# Patient Record
Sex: Male | Born: 1960 | Race: White | Hispanic: No | Smoking: Current some day smoker
Health system: Southern US, Community
[De-identification: ages and names within clinical notes are randomized; demographics above are authoritative.]

---

## 2020-04-04 ENCOUNTER — Other Ambulatory Visit: Payer: Self-pay

## 2020-04-04 ENCOUNTER — Emergency Department: Payer: Self-pay

## 2020-04-04 ENCOUNTER — Inpatient Hospital Stay
Admission: EM | Admit: 2020-04-04 | Discharge: 2020-05-03 | DRG: 871 | Disposition: E | Payer: Self-pay | Attending: Pulmonary Disease | Admitting: Pulmonary Disease

## 2020-04-04 DIAGNOSIS — A419 Sepsis, unspecified organism: Principal | ICD-10-CM | POA: Diagnosis present

## 2020-04-04 DIAGNOSIS — Z20822 Contact with and (suspected) exposure to covid-19: Secondary | ICD-10-CM | POA: Diagnosis present

## 2020-04-04 DIAGNOSIS — Z599 Problem related to housing and economic circumstances, unspecified: Secondary | ICD-10-CM

## 2020-04-04 DIAGNOSIS — J9801 Acute bronchospasm: Secondary | ICD-10-CM | POA: Diagnosis present

## 2020-04-04 DIAGNOSIS — K047 Periapical abscess without sinus: Secondary | ICD-10-CM | POA: Diagnosis present

## 2020-04-04 DIAGNOSIS — I5082 Biventricular heart failure: Secondary | ICD-10-CM | POA: Diagnosis present

## 2020-04-04 DIAGNOSIS — I5021 Acute systolic (congestive) heart failure: Secondary | ICD-10-CM | POA: Diagnosis present

## 2020-04-04 DIAGNOSIS — R570 Cardiogenic shock: Secondary | ICD-10-CM | POA: Diagnosis not present

## 2020-04-04 DIAGNOSIS — F17210 Nicotine dependence, cigarettes, uncomplicated: Secondary | ICD-10-CM | POA: Diagnosis present

## 2020-04-04 DIAGNOSIS — J90 Pleural effusion, not elsewhere classified: Secondary | ICD-10-CM

## 2020-04-04 DIAGNOSIS — E872 Acidosis: Secondary | ICD-10-CM | POA: Diagnosis present

## 2020-04-04 DIAGNOSIS — F419 Anxiety disorder, unspecified: Secondary | ICD-10-CM | POA: Diagnosis present

## 2020-04-04 DIAGNOSIS — I4891 Unspecified atrial fibrillation: Secondary | ICD-10-CM

## 2020-04-04 DIAGNOSIS — I493 Ventricular premature depolarization: Secondary | ICD-10-CM | POA: Diagnosis present

## 2020-04-04 DIAGNOSIS — R739 Hyperglycemia, unspecified: Secondary | ICD-10-CM | POA: Diagnosis present

## 2020-04-04 DIAGNOSIS — I469 Cardiac arrest, cause unspecified: Secondary | ICD-10-CM

## 2020-04-04 DIAGNOSIS — Z825 Family history of asthma and other chronic lower respiratory diseases: Secondary | ICD-10-CM

## 2020-04-04 DIAGNOSIS — J189 Pneumonia, unspecified organism: Secondary | ICD-10-CM

## 2020-04-04 DIAGNOSIS — I110 Hypertensive heart disease with heart failure: Secondary | ICD-10-CM | POA: Diagnosis present

## 2020-04-04 LAB — CBC
HCT: 43.4 % (ref 39.0–52.0)
Hemoglobin: 15.1 g/dL (ref 13.0–17.0)
MCH: 33.4 pg (ref 26.0–34.0)
MCHC: 34.8 g/dL (ref 30.0–36.0)
MCV: 96 fL (ref 80.0–100.0)
Platelets: 278 10*3/uL (ref 150–400)
RBC: 4.52 MIL/uL (ref 4.22–5.81)
RDW: 14.3 % (ref 11.5–15.5)
WBC: 11.7 10*3/uL — ABNORMAL HIGH (ref 4.0–10.5)
nRBC: 0 % (ref 0.0–0.2)

## 2020-04-04 LAB — BASIC METABOLIC PANEL
Anion gap: 15 (ref 5–15)
BUN: 12 mg/dL (ref 6–20)
CO2: 20 mmol/L — ABNORMAL LOW (ref 22–32)
Calcium: 8.6 mg/dL — ABNORMAL LOW (ref 8.9–10.3)
Chloride: 99 mmol/L (ref 98–111)
Creatinine, Ser: 1.22 mg/dL (ref 0.61–1.24)
GFR, Estimated: >60 mL/min (ref >60)
Glucose, Bld: 167 mg/dL — ABNORMAL HIGH (ref 70–99)
Potassium: 3.8 mmol/L (ref 3.5–5.1)
Sodium: 134 mmol/L — ABNORMAL LOW (ref 135–145)

## 2020-04-04 LAB — TROPONIN I (HIGH SENSITIVITY): Troponin I (High Sensitivity): 107 ng/L (ref <18)

## 2020-04-04 NOTE — ED Notes (Signed)
Troponin of 107 called from lab. Elon Jester, charge rn notified.

## 2020-04-04 NOTE — ED Notes (Signed)
Water provided. Explanation of wait provided to pt who verbalizes understanding.
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
## 2020-04-04 NOTE — ED Provider Notes (Signed)
[facility] Emergency Department Provider Note ____________________________________________   First MD Initiated Contact with Patient 04/26/2020 2344     (approximate)  I have reviewed the triage vital signs and the nursing notes.  HISTORY  Chief Complaint Back Pain   HPI Raymond Harvey is a 59 y.o. malewho presents to the ED for evaluation of back pain.   Chart review indicates no previous visits within our system. Patient reports that he lives locally but has not seen a physician in many years.  No known medical problems and no prescription medications. Lifelong cigarette smoker, currently smoking 1 PPD.  No other recreational drugs. Reports being prescribed antibiotics 2 days ago for dental infection by a dentist, reports compliance with these medications. He is not vaccinated for COVID-19, and has no known positive contacts.  Patient presents to the ED with 2 days of intermittent back pain, nonproductive cough and dyspnea on exertion.  He reports intermittent back pains, worsened in a pleuritic fashion with respirations, that last a matter of hours before self resolving.  He reports associated dyspnea on exertion.  He reports a consistent nonproductive cough for a few days increased from his baseline smoker's cough.  Denies increased sputum production.  Denies fevers, emesis, abdominal pain, diarrhea, sore throat, neck pain, falls, syncope or trauma.  He does reports associated increased lower extremity swelling, and decreased p.o. intake that he attributes to dental pain.  Reports decreased urinary output and dark urine without dysuria or hematuria.  He reports that he does not have insurance and has significant stress and anxiety related to the finances of this visits and likely hospitalization.   History reviewed. No pertinent past medical history.  Patient Active Problem List   Diagnosis Date Noted  . CAP (community acquired pneumonia) 04/05/2020     History reviewed. No pertinent surgical history.  Prior to Admission medications   Medication Sig Start Date End Date Taking? Authorizing Provider  amoxicillin-clavulanate (AUGMENTIN) 500-125 MG tablet Take 1 tablet by mouth in the morning, at noon, and at bedtime. 04/02/20  Yes [provider]    Allergies Patient has no allergy information on record.  History reviewed. No pertinent family history.  Social History Social History   Tobacco Use  . Smoking status: Current Some Day Smoker    Types: Cigarettes  . Smokeless tobacco: Never Used  Substance Use Topics  . Alcohol use: Not on file  . Drug use: Not on file    Review of Systems  Constitutional: No fever/chills Eyes: No visual changes. ENT: No sore throat. Cardiovascular: Positive for chest and back pain. Respiratory: Positive for shortness of breath, nonproductive cough and DOE Gastrointestinal: No abdominal pain.  No nausea, no vomiting.  No diarrhea.  No constipation. Genitourinary: Negative for dysuria.  Positive for decreased urinary output and dark urine. Musculoskeletal: Negative for back pain. Skin: Negative for rash. Neurological: Negative for headaches, focal weakness or numbness.  ____________________________________________   PHYSICAL EXAM:  VITAL SIGNS: Vitals:   04/05/20 0200 04/05/20 0215  BP: (!) 146/112 (!) 126/95  Pulse: (!) 33 62  Resp: (!) 26 (!) 25  Temp:    SpO2:       Constitutional: Alert and oriented. Well appearing and in no acute distress. Eyes: Conjunctivae are normal. PERRL. EOMI. Head: Atraumatic. Nose: No congestion/rhinnorhea. Mouth/Throat: Mucous membranes are moist.  Oropharynx non-erythematous. Neck: No stridor. No cervical spine tenderness to palpation. Cardiovascular: Normal rate, regular rhythm. Grossly normal heart sounds.  Good peripheral circulation. Respiratory:  Normal respiratory effort.  No retractions. Lungs CTAB. Gastrointestinal: Soft ,  nondistended, nontender to palpation. No CVA tenderness. Musculoskeletal: No lower extremity tenderness nor edema.  No joint effusions. No signs of acute trauma. Neurologic:  Normal speech and language. No gross focal neurologic deficits are appreciated. No gait instability noted. Skin:  Skin is warm, dry and intact. No rash noted. Psychiatric: Mood and affect are normal. Speech and behavior are normal.  ____________________________________________   LABS (all labs ordered are listed, but only abnormal results are displayed)  Labs Reviewed  CBC - Abnormal; Notable for the following components:      Result Value   WBC 11.7 (*)    All other components within normal limits  BASIC METABOLIC PANEL - Abnormal; Notable for the following components:   Sodium 134 (*)    CO2 20 (*)    Glucose, Bld 167 (*)    Calcium 8.6 (*)    All other components within normal limits  BRAIN NATRIURETIC PEPTIDE - Abnormal; Notable for the following components:   B Natriuretic Peptide 3,879.9 (*)    All other components within normal limits  LACTIC ACID, PLASMA - Abnormal; Notable for the following components:   Lactic Acid, Venous 3.0 (*)    All other components within normal limits  TROPONIN I (HIGH SENSITIVITY) - Abnormal; Notable for the following components:   Troponin I (High Sensitivity) 107 (*)    All other components within normal limits  TROPONIN I (HIGH SENSITIVITY) - Abnormal; Notable for the following components:   Troponin I (High Sensitivity) 91 (*)    All other components within normal limits  RESP PANEL BY RT-PCR (FLU A&B, COVID) ARPGX2  CULTURE, BLOOD (SINGLE)  EXPECTORATED SPUTUM ASSESSMENT W REFEX TO RESP CULTURE  MAGNESIUM  LACTIC ACID, PLASMA  HIV ANTIBODY (ROUTINE TESTING W REFLEX)  LEGIONELLA PNEUMOPHILA SEROGP 1 UR AG  STREP PNEUMONIAE URINARY ANTIGEN  TSH  BASIC METABOLIC PANEL  CBC   ____________________________________________  12 Lead EKG  A. fib, rate of 160 bpm.   Leftward axis.  1 PVC.  No evidence of acute ischemia. ____________________________________________  RADIOLOGY  ED MD interpretation:  2 view CXR reviewed by me with evidence of small bilateral pleural effusions  Official radiology report(s): DG Chest 2 View  Result Date: 04-14-2020 CLINICAL DATA:  Cough, shortness of breath EXAM: CHEST - 2 VIEW COMPARISON:  None. FINDINGS: Bilateral pleural effusions, left greater than right with some additional opacity in the left lung base some of which could be atelectatic though more patchy adjacent airspace opacity is suggestive of some underlying consolidation as well. No pneumothorax. Pulmonary vascularity is normally distributed. The cardiomediastinal contours are unremarkable. Degenerative changes are present in the imaged spine and shoulders. No acute osseous or soft tissue abnormality. IMPRESSION: Bilateral pleural effusions, left greater than right. Adjacent opacity could reflect a combination of atelectasis and underlying airspace disease. At minimum, follow-up imaging in 6-8 weeks following therapeutic regimen should be performed to ensure resolution. Electronically Signed   By: Kreg Shropshire M.D.   On: April 14, 2020 23:18   CT Angio Chest PE W and/or Wo Contrast  Result Date: 04/05/2020 CLINICAL DATA:  Pleuritic chest pain EXAM: CT ANGIOGRAPHY CHEST WITH CONTRAST TECHNIQUE: Multidetector CT imaging of the chest was performed using the standard protocol during bolus administration of intravenous contrast. Multiplanar CT image reconstructions and MIPs were obtained to evaluate the vascular anatomy. CONTRAST:  77mL OMNIPAQUE IOHEXOL 350 MG/ML SOLN COMPARISON:  None. FINDINGS: Cardiovascular: Contrast injection is sufficient  to demonstrate satisfactory opacification of the pulmonary arteries to the segmental level. There is no pulmonary embolus or evidence of right heart strain. The size of the main pulmonary artery is normal. Heart size is normal, with no  pericardial effusion. The course and caliber of the aorta are normal. There is no atherosclerotic calcification. Opacification decreased due to pulmonary arterial phase contrast bolus timing. Mediastinum/Nodes: No mediastinal, hilar or axillary lymphadenopathy. Normal visualized thyroid. Thoracic esophageal course is normal. Lungs/Pleura: Small right and large left pleural effusions. Area of consolidation in the peripheral left lower lobe. Upper Abdomen: Contrast bolus timing is not optimized for evaluation of the abdominal organs. The visualized portions of the organs of the upper abdomen are normal. Musculoskeletal: No chest wall abnormality. No bony spinal canal stenosis. Review of the MIP images confirms the above findings. IMPRESSION: 1. No pulmonary embolus. 2. Small right and large left pleural effusions. 3. Area of consolidation in the peripheral left lower lobe may indicate pneumonia, aspiration or atelectasis. Electronically Signed   By: Deatra Robinson M.D.   On: 04/05/2020 01:05    ____________________________________________   PROCEDURES and INTERVENTIONS  Procedure(s) performed (including Critical Care):  .1-3 Lead EKG Interpretation Performed by: Delton Prairie, MD Authorized by: Delton Prairie, MD     Interpretation: abnormal     ECG rate:  140   ECG rate assessment: tachycardic     Rhythm: atrial fibrillation     Ectopy: PVCs     Conduction: normal   .Critical Care Performed by: Delton Prairie, MD Authorized by: Delton Prairie, MD   Critical care provider statement:    Critical care time (minutes):  45   Critical care was necessary to treat or prevent imminent or life-threatening deterioration of the following conditions:  Cardiac failure and circulatory failure   Critical care was time spent personally by me on the following activities:  Discussions with consultants, evaluation of patient's response to treatment, examination of patient, ordering and performing treatments and  interventions, ordering and review of laboratory studies, ordering and review of radiographic studies, pulse oximetry, re-evaluation of patient's condition, obtaining history from patient or surrogate and review of old charts    Medications  lactated ringers infusion (has no administration in time range)  cefTRIAXone (ROCEPHIN) 2 g in sodium chloride 0.9 % 100 mL IVPB (0 g Intravenous Stopped 04/05/20 0143)  azithromycin (ZITHROMAX) 500 mg in sodium chloride 0.9 % 250 mL IVPB (500 mg Intravenous New Bag/Given 04/05/20 0200)  enoxaparin (LOVENOX) injection 40 mg (has no administration in time range)  0.9 %  sodium chloride infusion (has no administration in time range)  acetaminophen (TYLENOL) tablet 650 mg (has no administration in time range)    Or  acetaminophen (TYLENOL) suppository 650 mg (has no administration in time range)  traZODone (DESYREL) tablet 25 mg (has no administration in time range)  magnesium hydroxide (MILK OF MAGNESIA) suspension 30 mL (has no administration in time range)  ondansetron (ZOFRAN) tablet 4 mg (has no administration in time range)    Or  ondansetron (ZOFRAN) injection 4 mg (has no administration in time range)  guaiFENesin (MUCINEX) 12 hr tablet 600 mg (has no administration in time range)  chlorpheniramine-HYDROcodone (TUSSIONEX) 10-8 MG/5ML suspension 5 mL (has no administration in time range)  aspirin chewable tablet 324 mg (has no administration in time range)  diltiazem (CARDIZEM) 125 mg in dextrose 5% 125 mL (1 mg/mL) infusion (has no administration in time range)  morphine 4 MG/ML injection 4 mg (has no administration  in time range)  ipratropium-albuterol (DUONEB) 0.5-2.5 (3) MG/3ML nebulizer solution 3 mL (has no administration in time range)  diltiazem (CARDIZEM) injection 20 mg (20 mg Intravenous Given 04/05/20 0037)  lactated ringers bolus 500 mL (500 mLs Intravenous New Bag/Given 04/05/20 0110)  iohexol (OMNIPAQUE) 350 MG/ML injection 75 mL (75 mLs  Intravenous Contrast Given 04/05/20 0044)  LORazepam (ATIVAN) tablet 0.5 mg (0.5 mg Oral Given 04/05/20 0119)    ____________________________________________   MDM / ED COURSE   59 year old smoker presents to the ED with new onset A. fib, likely precipitated by pleural effusions caused by pneumonia, requiring medical admission.  Patient hemodynamically stable in A. fib with RVR, on room air.  Exam demonstrates a very anxious patient who has no evidence of trauma or neurovascular deficits.  Patient provided bolus of IV diltiazem with good effect, and subsequently transitioned to a diltiazem drip for rate control.  His blood work demonstrates a lactic acidosis to 3.0 as well as an elevated troponin to 100, likely demand ischemia in the setting of his rapid rates for the past 2 days.  Repeat troponin downtrending.  EKG shows no ischemic features and his A. fib with RVR.  CXR shows a possible infiltrate on the left.  There is lack of documented medical history, pleuritic pains and new A. fib, CTA chest obtained and does not show evidence of acute PE, but does demonstrate a large left-sided pleural effusion around a small focal infiltrate to his LLL concerning for CAP.  Blood cultures were drawn and patient was provided CAP coverage.  Patient bolused 500 cc of fluids, and further bolus was held due to evidence of volume overload clinically and with his elevated BNP.  No indications for urgent thoracentesis in the ED, considering he is stable on room air.  Possibly parapneumonic effusion versus malignant in setting of his lifelong of smoking.  Admitted to hospitalist.   Clinical Course as of Apr 05 304  Sat Apr 05, 2020  0023 Elevated lactic acid noted.  Due to his infiltrate on x-ray and mild leukocytosis, concern for sepsis.  We will initiate sepsis order set, draw blood cultures and provide CAP coverage   [DS]  0025 Due to his lower extremity edema, hemodynamically stable status, we will currently  abstain from full 20 cc/kg of IV fluids as we await the remainder of his work-up   [DS]  0044 Reassessed.  Patient in CT   [DS]  0103 Reassessed.  Heart rate back up into the 140s.  We will start diltiazem drip.   [DS]  0104 Patient continues to be quite nervous and continues to stare at the telemetry monitor, asking about all the numbers.  This is the third time of explained everything to him.  I have attempted to alter the alert settings so the long does not go off as much due to his elevated heart rate and A. fib to help alleviate his anxiety.   [DS]  0104 We will provide a small oral dose of a calming agent   [DS]  0125 Educated patient on CT findings.   [DS]    Clinical Course User Index [DS] Delton PrairieSmith, Layloni Fahrner, MD    ____________________________________________   FINAL CLINICAL IMPRESSION(S) / ED DIAGNOSES  Final diagnoses:  New onset atrial fibrillation (HCC)  Atrial fibrillation with RVR (HCC)  Bilateral pleural effusion  Community acquired pneumonia of left lower lobe of lung     ED Discharge Orders    None  Delton Prairie   Note:  This document was prepared using Conservation officer, historic buildings and may include unintentional dictation errors.   Delton Prairie, MD 04/05/20 [phone]

## 2020-04-04 NOTE — ED Triage Notes (Signed)
Pt to ED via [facility] from home. Pt stating mid upper back pain and increased SOB for a few days. Pt stating pain increases with deep inspirations. Pt stating on antibiotics Tuesday for tooth infection.  Pt denies any medical hx.

## 2020-04-05 ENCOUNTER — Emergency Department: Payer: Self-pay

## 2020-04-05 ENCOUNTER — Inpatient Hospital Stay (HOSPITAL_COMMUNITY)
Admit: 2020-04-05 | Discharge: 2020-04-05 | Disposition: A | Discharge status: Home / Self Care | Payer: Self-pay | Attending: Family Medicine | Admitting: Family Medicine

## 2020-04-05 ENCOUNTER — Encounter: Payer: Self-pay | Admitting: Radiology

## 2020-04-05 DIAGNOSIS — R030 Elevated blood-pressure reading, without diagnosis of hypertension: Secondary | ICD-10-CM

## 2020-04-05 DIAGNOSIS — J189 Pneumonia, unspecified organism: Secondary | ICD-10-CM | POA: Diagnosis present

## 2020-04-05 DIAGNOSIS — A419 Sepsis, unspecified organism: Principal | ICD-10-CM

## 2020-04-05 DIAGNOSIS — I5020 Unspecified systolic (congestive) heart failure: Secondary | ICD-10-CM | POA: Insufficient documentation

## 2020-04-05 DIAGNOSIS — R778 Other specified abnormalities of plasma proteins: Secondary | ICD-10-CM

## 2020-04-05 DIAGNOSIS — R739 Hyperglycemia, unspecified: Secondary | ICD-10-CM

## 2020-04-05 DIAGNOSIS — I5031 Acute diastolic (congestive) heart failure: Secondary | ICD-10-CM

## 2020-04-05 DIAGNOSIS — R6520 Severe sepsis without septic shock: Secondary | ICD-10-CM

## 2020-04-05 DIAGNOSIS — I340 Nonrheumatic mitral (valve) insufficiency: Secondary | ICD-10-CM

## 2020-04-05 DIAGNOSIS — I4891 Unspecified atrial fibrillation: Secondary | ICD-10-CM

## 2020-04-05 DIAGNOSIS — I361 Nonrheumatic tricuspid (valve) insufficiency: Secondary | ICD-10-CM

## 2020-04-05 DIAGNOSIS — Z720 Tobacco use: Secondary | ICD-10-CM

## 2020-04-05 LAB — LACTIC ACID, PLASMA
Lactic Acid, Venous: 6.3 mmol/L (ref 0.5–1.9)
Lactic Acid, Venous: 3.5 mmol/L (ref 0.5–1.9)
Lactic Acid, Venous: 2.8 mmol/L (ref 0.5–1.9)
Lactic Acid, Venous: 3 mmol/L (ref 0.5–1.9)
Lactic Acid, Venous: 2.6 mmol/L (ref 0.5–1.9)
Lactic Acid, Venous: 2.8 mmol/L (ref 0.5–1.9)

## 2020-04-05 LAB — CBC
HCT: 42.3 % (ref 39.0–52.0)
Hemoglobin: 14 g/dL (ref 13.0–17.0)
MCH: 32.6 pg (ref 26.0–34.0)
MCHC: 33.1 g/dL (ref 30.0–36.0)
MCV: 98.4 fL (ref 80.0–100.0)
Platelets: 256 10*3/uL (ref 150–400)
RBC: 4.3 MIL/uL (ref 4.22–5.81)
RDW: 14.5 % (ref 11.5–15.5)
WBC: 12.6 10*3/uL — ABNORMAL HIGH (ref 4.0–10.5)
nRBC: 0 % (ref 0.0–0.2)

## 2020-04-05 LAB — LIPID PANEL
Cholesterol: 114 mg/dL (ref 0–200)
HDL: 23 mg/dL — ABNORMAL LOW (ref >40)
LDL Cholesterol: 78 mg/dL (ref 0–99)
Total CHOL/HDL Ratio: 5 RATIO
Triglycerides: 63 mg/dL (ref <150)
VLDL: 13 mg/dL (ref 0–40)

## 2020-04-05 LAB — RESP PANEL BY RT-PCR (FLU A&B, COVID) ARPGX2
Influenza A by PCR: NEGATIVE
Influenza B by PCR: NEGATIVE
SARS Coronavirus 2 by RT PCR: NEGATIVE

## 2020-04-05 LAB — ECHOCARDIOGRAM COMPLETE
Calc EF: 16.2 %
Height: 68 in
S' Lateral: 5.74 cm
Single Plane A2C EF: 13.1 %
Single Plane A4C EF: 13.4 %
Weight: 2240 oz

## 2020-04-05 LAB — HIV ANTIBODY (ROUTINE TESTING W REFLEX): HIV Screen 4th Generation wRfx: NONREACTIVE

## 2020-04-05 LAB — TSH: TSH: 3.354 u[IU]/mL (ref 0.350–4.500)

## 2020-04-05 LAB — PROCALCITONIN: Procalcitonin: 0.42 ng/mL

## 2020-04-05 LAB — PROTIME-INR
INR: 1.4 — ABNORMAL HIGH (ref 0.8–1.2)
Prothrombin Time: 16.7 seconds — ABNORMAL HIGH (ref 11.4–15.2)

## 2020-04-05 LAB — MAGNESIUM: Magnesium: 1.7 mg/dL (ref 1.7–2.4)

## 2020-04-05 LAB — HEPARIN LEVEL (UNFRACTIONATED): Heparin Unfractionated: 0.38 IU/mL (ref 0.30–0.70)

## 2020-04-05 LAB — TROPONIN I (HIGH SENSITIVITY): Troponin I (High Sensitivity): 91 ng/L — ABNORMAL HIGH (ref <18)

## 2020-04-05 LAB — HEMOGLOBIN A1C
Hgb A1c MFr Bld: 6.2 % — ABNORMAL HIGH (ref 4.8–5.6)
Mean Plasma Glucose: 131.24 mg/dL

## 2020-04-05 LAB — BRAIN NATRIURETIC PEPTIDE: B Natriuretic Peptide: 3879.9 pg/mL — ABNORMAL HIGH (ref 0.0–100.0)

## 2020-04-05 LAB — STREP PNEUMONIAE URINARY ANTIGEN: Strep Pneumo Urinary Antigen: NEGATIVE

## 2020-04-05 LAB — APTT: aPTT: 31 seconds (ref 24–36)

## 2020-04-05 MED ORDER — MAGNESIUM HYDROXIDE 400 MG/5ML PO SUSP: 30.0000 mL | Freq: Every day | ORAL | Status: DC | PRN

## 2020-04-05 MED ORDER — MAGNESIUM SULFATE 2 GM/50ML IV SOLN
2.0000 g | Freq: Once | INTRAVENOUS | Status: AC
  Administered 2020-04-05: 2 g via INTRAVENOUS
  Filled 2020-04-05: qty 50

## 2020-04-05 MED ORDER — METHYLPREDNISOLONE SODIUM SUCC 40 MG IJ SOLR
40.0000 mg | Freq: Three times a day (TID) | INTRAMUSCULAR | Status: DC
  Administered 2020-04-05 (×2): 40 mg via INTRAVENOUS
  Filled 2020-04-05 (×2): qty 1

## 2020-04-05 MED ORDER — SODIUM CHLORIDE 0.9 % IV SOLN: INTRAVENOUS | Status: DC

## 2020-04-05 MED ORDER — FUROSEMIDE 10 MG/ML IJ SOLN
INTRAMUSCULAR | Status: AC
  Administered 2020-04-05: 40 mg via INTRAVENOUS
  Filled 2020-04-05: qty 4

## 2020-04-05 MED ORDER — POTASSIUM CHLORIDE 20 MEQ PO PACK
40.0000 meq | PACK | Freq: Once | ORAL | Status: AC
  Administered 2020-04-05: 40 meq via ORAL
  Filled 2020-04-05: qty 2

## 2020-04-05 MED ORDER — ACETAMINOPHEN 650 MG RE SUPP: 650.0000 mg | Freq: Four times a day (QID) | RECTAL | Status: DC | PRN

## 2020-04-05 MED ORDER — SODIUM CHLORIDE 0.9 % IV SOLN
2.0000 g | INTRAVENOUS | Status: DC
  Administered 2020-04-05 – 2020-04-06 (×2): 2 g via INTRAVENOUS
  Filled 2020-04-05 (×2): qty 20

## 2020-04-05 MED ORDER — HEPARIN BOLUS VIA INFUSION
4000.0000 [IU] | Freq: Once | INTRAVENOUS | Status: AC
  Administered 2020-04-05: 4000 [IU] via INTRAVENOUS
  Filled 2020-04-05: qty 4000

## 2020-04-05 MED ORDER — NITROGLYCERIN 2 % TD OINT
1.0000 [in_us] | TOPICAL_OINTMENT | Freq: Four times a day (QID) | TRANSDERMAL | Status: DC
  Administered 2020-04-05: 1 [in_us] via TOPICAL
  Filled 2020-04-05 (×2): qty 1

## 2020-04-05 MED ORDER — IOHEXOL 350 MG/ML SOLN
75.0000 mL | Freq: Once | INTRAVENOUS | Status: AC | PRN
  Administered 2020-04-05: 75 mL via INTRAVENOUS

## 2020-04-05 MED ORDER — LACTATED RINGERS IV BOLUS: 1000.0000 mL | Freq: Once | INTRAVENOUS | Status: DC

## 2020-04-05 MED ORDER — ASPIRIN EC 81 MG PO TBEC
81.0000 mg | DELAYED_RELEASE_TABLET | Freq: Every day | ORAL | Status: DC
  Administered 2020-04-05: 81 mg via ORAL
  Filled 2020-04-05: qty 1

## 2020-04-05 MED ORDER — ALUM & MAG HYDROXIDE-SIMETH 200-200-20 MG/5ML PO SUSP
30.0000 mL | Freq: Once | ORAL | Status: AC
  Administered 2020-04-05: 30 mL via ORAL
  Filled 2020-04-05: qty 30

## 2020-04-05 MED ORDER — MORPHINE SULFATE (PF) 4 MG/ML IV SOLN
4.0000 mg | Freq: Once | INTRAVENOUS | Status: AC
  Administered 2020-04-05: 4 mg via INTRAVENOUS
  Filled 2020-04-05: qty 1

## 2020-04-05 MED ORDER — DILTIAZEM HCL 25 MG/5ML IV SOLN
20.0000 mg | Freq: Once | INTRAVENOUS | Status: AC
  Administered 2020-04-05: 20 mg via INTRAVENOUS
  Filled 2020-04-05: qty 5

## 2020-04-05 MED ORDER — PANTOPRAZOLE SODIUM 40 MG IV SOLR
40.0000 mg | Freq: Once | INTRAVENOUS | Status: AC
  Administered 2020-04-05: 40 mg via INTRAVENOUS
  Filled 2020-04-05: qty 40

## 2020-04-05 MED ORDER — DILTIAZEM HCL-DEXTROSE 125-5 MG/125ML-% IV SOLN (PREMIX)
5.0000 mg/h | INTRAVENOUS | Status: DC
  Administered 2020-04-05: 7.5 mg/h via INTRAVENOUS

## 2020-04-05 MED ORDER — METOPROLOL TARTRATE 25 MG PO TABS
25.0000 mg | ORAL_TABLET | Freq: Four times a day (QID) | ORAL | Status: DC
  Administered 2020-04-05 (×2): 25 mg via ORAL
  Filled 2020-04-05 (×2): qty 1

## 2020-04-05 MED ORDER — CALCIUM CARBONATE ANTACID 500 MG PO CHEW
1.0000 | CHEWABLE_TABLET | Freq: Two times a day (BID) | ORAL | Status: DC | PRN
  Administered 2020-04-05: 200 mg via ORAL
  Filled 2020-04-05: qty 1

## 2020-04-05 MED ORDER — ENOXAPARIN SODIUM 40 MG/0.4ML ~~LOC~~ SOLN
40.0000 mg | SUBCUTANEOUS | Status: DC
  Administered 2020-04-05: 40 mg via SUBCUTANEOUS
  Filled 2020-04-05: qty 0.4

## 2020-04-05 MED ORDER — SODIUM CHLORIDE 0.9 % IV SOLN
500.0000 mg | INTRAVENOUS | Status: DC
  Administered 2020-04-05 – 2020-04-06 (×2): 500 mg via INTRAVENOUS
  Filled 2020-04-05 (×2): qty 500

## 2020-04-05 MED ORDER — FUROSEMIDE 10 MG/ML IJ SOLN: 40.0000 mg | Freq: Two times a day (BID) | INTRAMUSCULAR | Status: DC

## 2020-04-05 MED ORDER — METOPROLOL TARTRATE 25 MG PO TABS
25.0000 mg | ORAL_TABLET | Freq: Two times a day (BID) | ORAL | Status: DC
  Administered 2020-04-05: 25 mg via ORAL
  Filled 2020-04-05: qty 1

## 2020-04-05 MED ORDER — FUROSEMIDE 10 MG/ML IJ SOLN
40.0000 mg | Freq: Two times a day (BID) | INTRAMUSCULAR | Status: DC
  Administered 2020-04-05: 40 mg via INTRAVENOUS
  Filled 2020-04-05: qty 4

## 2020-04-05 MED ORDER — SODIUM CHLORIDE 0.9 % IV SOLN: 2.0000 g | INTRAVENOUS | Status: DC

## 2020-04-05 MED ORDER — GUAIFENESIN ER 600 MG PO TB12
600.0000 mg | ORAL_TABLET | Freq: Two times a day (BID) | ORAL | Status: DC
  Administered 2020-04-05 (×3): 600 mg via ORAL
  Filled 2020-04-05 (×3): qty 1

## 2020-04-05 MED ORDER — LACTATED RINGERS IV BOLUS
1000.0000 mL | Freq: Once | INTRAVENOUS | Status: AC
  Administered 2020-04-05: 1000 mL via INTRAVENOUS

## 2020-04-05 MED ORDER — ONDANSETRON HCL 4 MG PO TABS: 4.0000 mg | ORAL_TABLET | Freq: Four times a day (QID) | ORAL | Status: DC | PRN

## 2020-04-05 MED ORDER — SODIUM CHLORIDE 0.9 % IV SOLN: 500.0000 mg | INTRAVENOUS | Status: DC

## 2020-04-05 MED ORDER — PREDNISONE 20 MG PO TABS: 40.0000 mg | ORAL_TABLET | Freq: Every day | ORAL | Status: DC

## 2020-04-05 MED ORDER — ONDANSETRON HCL 4 MG/2ML IJ SOLN
4.0000 mg | Freq: Four times a day (QID) | INTRAMUSCULAR | Status: DC | PRN
  Filled 2020-04-05: qty 2

## 2020-04-05 MED ORDER — ACETAMINOPHEN 325 MG PO TABS
650.0000 mg | ORAL_TABLET | Freq: Four times a day (QID) | ORAL | Status: DC | PRN
  Administered 2020-04-05: 650 mg via ORAL
  Filled 2020-04-05: qty 2

## 2020-04-05 MED ORDER — PANTOPRAZOLE SODIUM 40 MG PO TBEC: 40.0000 mg | DELAYED_RELEASE_TABLET | Freq: Every day | ORAL | Status: DC

## 2020-04-05 MED ORDER — KETOROLAC TROMETHAMINE 30 MG/ML IJ SOLN
30.0000 mg | Freq: Once | INTRAMUSCULAR | Status: AC
  Administered 2020-04-05: 30 mg via INTRAVENOUS
  Filled 2020-04-05: qty 1

## 2020-04-05 MED ORDER — ASPIRIN 81 MG PO CHEW
324.0000 mg | CHEWABLE_TABLET | Freq: Once | ORAL | Status: AC
  Administered 2020-04-05: 324 mg via ORAL
  Filled 2020-04-05: qty 4

## 2020-04-05 MED ORDER — LACTATED RINGERS IV BOLUS
500.0000 mL | Freq: Once | INTRAVENOUS | Status: AC
  Administered 2020-04-05: 500 mL via INTRAVENOUS

## 2020-04-05 MED ORDER — IPRATROPIUM-ALBUTEROL 0.5-2.5 (3) MG/3ML IN SOLN
3.0000 mL | Freq: Once | RESPIRATORY_TRACT | Status: AC
  Administered 2020-04-05: 3 mL via RESPIRATORY_TRACT
  Filled 2020-04-05: qty 3

## 2020-04-05 MED ORDER — DILTIAZEM HCL-DEXTROSE 125-5 MG/125ML-% IV SOLN (PREMIX)
5.0000 mg/h | INTRAVENOUS | Status: DC
  Administered 2020-04-05: 5 mg/h via INTRAVENOUS
  Filled 2020-04-05: qty 125

## 2020-04-05 MED ORDER — HEPARIN (PORCINE) 25000 UT/250ML-% IV SOLN
1000.0000 [IU]/h | INTRAVENOUS | Status: DC
  Administered 2020-04-05: 750 [IU]/h via INTRAVENOUS
  Filled 2020-04-05: qty 250

## 2020-04-05 MED ORDER — LORAZEPAM 0.5 MG PO TABS
0.5000 mg | ORAL_TABLET | Freq: Once | ORAL | Status: AC
  Administered 2020-04-05: 0.5 mg via ORAL
  Filled 2020-04-05: qty 1

## 2020-04-05 MED ORDER — HYDROCOD POLST-CPM POLST ER 10-8 MG/5ML PO SUER: 5.0000 mL | Freq: Two times a day (BID) | ORAL | Status: DC | PRN

## 2020-04-05 MED ORDER — LACTATED RINGERS IV SOLN: INTRAVENOUS | Status: DC

## 2020-04-05 MED ORDER — TRAZODONE HCL 50 MG PO TABS: 25.0000 mg | ORAL_TABLET | Freq: Every evening | ORAL | Status: DC | PRN

## 2020-04-05 NOTE — ED Notes (Signed)
Paged admit MD  Pt has converted to sinus, HR in 60's, pressure 92/74. Will hold nitropaste and metop. Do you want anything else?

## 2020-04-05 NOTE — Consult Note (Signed)
ANTICOAGULATION CONSULT NOTE - Initial Consult  Pharmacy Consult for Heparin Drip Indication: chest pain/ACS/STEMI  Not on File  Patient Measurements: Height: 5\' 8"  (172.7 cm) Weight: 63.5 kg (140 lb) IBW/kg (Calculated) : 68.4 Heparin Dosing Weight: 63.5kg  Vital Signs: Temp: 97.5 F (36.4 C) (12/04 1815) Temp Source: Oral (12/04 1815) BP: 92/74 (12/04 1817) Pulse Rate: 59 (12/04 1817)  Labs: Recent Labs    04/20/2020 2000 04/05/20 0045 04/05/20 0341 04/05/20 1146 04/05/20 1833  HGB 15.1  --  14.0  --   --   HCT 43.4  --  42.3  --   --   PLT 278  --  256  --   --   APTT  --   --   --  31  --   LABPROT  --   --   --  16.7*  --   INR  --   --   --  1.4*  --   HEPARINUNFRC  --   --   --   --  0.38  CREATININE 1.22  --   --   --   --   TROPONINIHS 107* 91*  --   --   --     Estimated Creatinine Clearance: 57.8 mL/min (by C-G formula based on SCr of 1.22 mg/dL).   Medical History: History reviewed. No pertinent past medical history.  Medications:  No PTA anticoagulation of record  Assessment: 59 yo male with history of ongoing tobacco use not seen outpatient in ~ 10 years with elevated BNP, trending troponins.  Baseline APTT and INR pending  12/4 18:33 HL 0.38, therapeutic x 1  Goal of Therapy:  Heparin level 0.3-0.7 units/ml Monitor platelets by anticoagulation protocol: Yes   Plan:   HL 0.38 is therapeutic, will continue with current rate of 750 Units/hr  Recheck HL in 6 hours for confirmation  Daily CBC while on Heparin drip  14/4, PharmD, BCPS 04/05/2020 7:18 PM

## 2020-04-05 NOTE — Progress Notes (Signed)
CODE SEPSIS - PHARMACY COMMUNICATION  **Broad Spectrum Antibiotics should be administered within 1 hour of Sepsis diagnosis**  Time Code Sepsis Called/Page Received: 12/4 @ 0025   Antibiotics Ordered: Ceftriaxone, Azithromycin   Time of 1st antibiotic administration: Ceftriaxone 1 gm IV X 1 on 12/4 @ 0113   Additional action taken by pharmacy:   If necessary, Name of Provider/Nurse Contacted:     Jamichael Knotts D ,PharmD Clinical Pharmacist  04/05/2020  1:23 AM

## 2020-04-05 NOTE — ED Notes (Addendum)
Order for 30mg  toradol via secure chat from MD [facility]

## 2020-04-05 NOTE — Sepsis Progress Note (Signed)
Notified bedside nurse of need to order and draw repeat lactic acid. 

## 2020-04-05 NOTE — ED Notes (Signed)
Spoke to nurse Renato Gails that Bed is Ready.

## 2020-04-05 NOTE — ED Notes (Signed)
Echo being done at bedside.

## 2020-04-05 NOTE — ED Notes (Signed)
Date and time results received: 04/05/20 0023  Test: Lactic Critical Value: 3.0  Name of Provider Notified: Adaline Sill, MD  Orders Received? Or Actions Taken?:orders received

## 2020-04-05 NOTE — Consult Note (Addendum)
Cardiology Consultation:   Patient ID: Raymond Harvey; [id number]; 1960/05/06   Admit date: Apr 21, 2020 Date of Consult: 04/05/2020  Primary Care Provider: Patient, No Pcp Per Primary Cardiologist: New to [facility] - consult by [place] Primary Electrophysiologist:  None   Patient Profile:   Raymond Harvey is a 59 y.o. male with a hx of ongoing tobacco use who is being seen today for the evaluation of A. fib with RVR, elevated troponin, and volume overload at the request of Dr. Arville Care.  History of Present Illness:   [name] has no previously known cardiac history.  He has not seen a physician in the outpatient setting in greater than 10 years.  He is not on any standing prescription medications.  He has a history of ongoing tobacco use at 1 pack/day for greater than 20 pack years.  He drinks 2-3 beers on the weekends and denies illicit substances.  He reports a family history significant for cerebral aneurysm in his mother and COPD in his father.  He denies any known family history of CAD or arrhythmias.  He has been in his usual state of health up until this past week around 11/29 when he began to develop lower extremity swelling, shortness of breath, intermittent chest discomfort, and a nonproductive cough.  He was seen at a dentist and prescribed Augmentin for a dental infection.  Over this past week he has noted a slow progression in the above symptoms.  He indicates his left-sided chest discomfort is worse with stress and exertion.  It is described as pressure-like and is nonradiating.  He denies any abdominal distention, orthopnea, PND, or early satiety.  He does indicate he tries to watch his salt intake and does not eat out at restaurants frequently.  Due to progressive dyspnea, chest discomfort, and continued lower extremity swelling he presented to [facility] ED.  Upon the patient's arrival to [facility] they were found to have BP in the 140s over 100s currently trending to 1 teens over 80s, HR  160s bpm, temp afebrile, oxygen saturation 97% on room air though currently on supplemental oxygen via nasal cannula at 2 L with O2 saturations of 94%, weight 63.5 kg. EKG showed A. fib with RVR as detailed below, CXR showed bilateral pleural effusions with the left greater than right as well as an adjacent opacity along the left lung base suggestive of underlying consolidation.  CTA of the chest was negative for PE with small right and large left pleural effusions as well as an area of consolidation along the peripheral left lower lobe.  Labs showed an initial high-sensitivity troponin of 107 with a delta of 91, BNP 3879, WBC 11.7, Hgb 15.1, PLT 278, potassium 3.8, glucose 167, BUN 12, serum creatinine 1.22, magnesium 1.7, lactic acid 3.0 trending to 6.3 with a most recent value of 2.6, PCT 0.42, Covid and influenza negative, TSH normal.  In the ED he received ASA 324 mg x 1, azithromycin, Rocephin, IV push diltiazem, IV Lasix 40 mg, 1500 mL of lactated Ringer, DuoNeb, IV steroids, magnesium sulfate, potassium, morphine, Nitropaste, and was placed on a diltiazem drip.  Currently, he notes his chest pain is resolved and breathing is significantly improved though not back to baseline.  He denies any tachypalpitations.  History reviewed. No pertinent past medical history.  History reviewed. No pertinent surgical history.   Home Meds: Prior to Admission medications   Medication Sig Start Date End Date Taking? Authorizing Provider  amoxicillin-clavulanate (AUGMENTIN) 500-125 MG tablet Take 1  tablet by mouth in the morning, at noon, and at bedtime. 04/02/20  Yes [provider]    Inpatient Medications: Scheduled Meds: . aspirin EC  81 mg Oral Daily  . enoxaparin (LOVENOX) injection  40 mg Subcutaneous Q24H  . furosemide  40 mg Intravenous Q12H  . guaiFENesin  600 mg Oral BID  . methylPREDNISolone (SOLU-MEDROL) injection  40 mg Intravenous Q8H  . metoprolol tartrate  25 mg Oral BID  .  nitroGLYCERIN  1 inch Topical Q6H   Continuous Infusions: . azithromycin Stopped (04/05/20 0300)  . cefTRIAXone (ROCEPHIN)  IV Stopped (04/05/20 0143)   PRN Meds: acetaminophen **OR** acetaminophen, chlorpheniramine-HYDROcodone, magnesium hydroxide, ondansetron **OR** ondansetron (ZOFRAN) IV, traZODone  Allergies:  Not on File  Social History:   Social History   Socioeconomic History  . Marital status: Unknown    Spouse name: Not on file  . Number of children: Not on file  . Years of education: Not on file  . Highest education level: Not on file  Occupational History  . Not on file  Tobacco Use  . Smoking status: Current Some Day Smoker    Types: Cigarettes  . Smokeless tobacco: Never Used  Substance and Sexual Activity  . Alcohol use: Not on file  . Drug use: Not on file  . Sexual activity: Not on file  Other Topics Concern  . Not on file  Social History Narrative  . Not on file   Social Determinants of Health   Financial Resource Strain:   . Difficulty of Paying Living Expenses: Not on file  Food Insecurity:   . Worried About Programme researcher, broadcasting/film/video in the Last Year: Not on file  . Ran Out of Food in the Last Year: Not on file  Transportation Needs:   . Lack of Transportation (Medical): Not on file  . Lack of Transportation (Non-Medical): Not on file  Physical Activity:   . Days of Exercise per Week: Not on file  . Minutes of Exercise per Session: Not on file  Stress:   . Feeling of Stress : Not on file  Social Connections:   . Frequency of Communication with Friends and Family: Not on file  . Frequency of Social Gatherings with Friends and Family: Not on file  . Attends Religious Services: Not on file  . Active Member of Clubs or Organizations: Not on file  . Attends Banker Meetings: Not on file  . Marital Status: Not on file  Intimate Partner Violence:   . Fear of Current or Ex-Partner: Not on file  . Emotionally Abused: Not on file  .  Physically Abused: Not on file  . Sexually Abused: Not on file     Family History:  Family History  Problem Relation Age of Onset  . Cerebral aneurysm Mother   . COPD Father     ROS:  Review of Systems  Constitutional: Positive for malaise/fatigue. Negative for chills, diaphoresis, fever and weight loss.  HENT: Negative for congestion.   Eyes: Negative for discharge and redness.  Respiratory: Positive for cough and shortness of breath. Negative for sputum production and wheezing.   Cardiovascular: Positive for chest pain and leg swelling. Negative for palpitations, orthopnea, claudication and PND.  Gastrointestinal: Negative for abdominal pain, blood in stool, heartburn, melena, nausea and vomiting.  Musculoskeletal: Negative for falls and myalgias.  Skin: Negative for rash.  Neurological: Positive for weakness. Negative for dizziness, tingling, tremors, sensory change, speech change, focal weakness and loss of consciousness.  Endo/Heme/Allergies: Does not bruise/bleed easily.  Psychiatric/Behavioral: Negative for substance abuse. The patient is not nervous/anxious.   All other systems reviewed and are negative.     Physical Exam/Data:   Vitals:   04/05/20 0845 04/05/20 0900 04/05/20 0915 04/05/20 0945  BP:  (!) 123/96 116/88 (!) 139/106  Pulse: (!) 51 77 (!) 54 73  Resp:      Temp:      TempSrc:      SpO2:  94% 95%   Weight:      Height:        Intake/Output Summary (Last 24 hours) at 04/05/2020 1003 Last data filed at 04/05/2020 0615 Gross per 24 hour  Intake --  Output 700 ml  Net -700 ml   Filed Weights   04/29/2020 1950  Weight: 63.5 kg   Body mass index is 21.29 kg/m.   Physical Exam: General: Well developed, well nourished, in no acute distress. Head: Normocephalic, atraumatic, sclera non-icteric, no xanthomas, nares without discharge.  Neck: Negative for carotid bruits. JVD elevated approximately 10 cm. Lungs: Coarse breath sounds bilaterally with  diminished breath sounds along the bilateral bases particularly the left base. Breathing is unlabored. Heart: Irregular with S1 S2. No murmurs, rubs, or gallops appreciated. Abdomen: Soft, non-tender, non-distended with normoactive bowel sounds. No hepatomegaly. No rebound/guarding. No obvious abdominal masses. Msk:  Strength and tone appear normal for age. Extremities: No clubbing or cyanosis.  Trace to 1+ bilateral lower extremity pitting edema. Distal pedal pulses are 2+ and equal bilaterally. Neuro: Alert and oriented X 3. No facial asymmetry. No focal deficit. Moves all extremities spontaneously. Psych:  Responds to questions appropriately with a normal affect.   EKG:  The EKG was personally reviewed and demonstrates: A. fib with RVR, 160 bpm, left axis deviation, rare PVC, poor R wave progression along the precordial leads, nonspecific lateral ST-T changes Telemetry:  Telemetry was personally reviewed and demonstrates: A. fib with RVR upon presentation currently in sinus rhythm with PACs and PVCs versus MAT  Weights: Filed Weights   04/30/2020 1950  Weight: 63.5 kg    Relevant CV Studies:  2D echo 04/05/2020: Pending  Laboratory Data:  Chemistry Recent Labs  Lab 04/29/2020 2000  NA 134*  K 3.8  CL 99  CO2 20*  GLUCOSE 167*  BUN 12  CREATININE 1.22  CALCIUM 8.6*  GFRNONAA >60  ANIONGAP 15    No results for input(s): PROT, ALBUMIN, AST, ALT, ALKPHOS, BILITOT in the last 168 hours. Hematology Recent Labs  Lab 04/18/2020 2000 04/05/20 0341  WBC 11.7* 12.6*  RBC 4.52 4.30  HGB 15.1 14.0  HCT 43.4 42.3  MCV 96.0 98.4  MCH 33.4 32.6  MCHC 34.8 33.1  RDW 14.3 14.5  PLT 278 256   Cardiac EnzymesNo results for input(s): TROPONINI in the last 168 hours. No results for input(s): TROPIPOC in the last 168 hours.  BNP Recent Labs  Lab 04/25/2020 2351  BNP 3,879.9*    DDimer No results for input(s): DDIMER in the last 168 hours.  Radiology/Studies:  DG Chest 2  View  Result Date: 04/13/2020 IMPRESSION: Bilateral pleural effusions, left greater than right. Adjacent opacity could reflect a combination of atelectasis and underlying airspace disease. At minimum, follow-up imaging in 6-8 weeks following therapeutic regimen should be performed to ensure resolution. Electronically Signed   By: Kreg Shropshire M.D.   On: 05/02/2020 23:18   CT Angio Chest PE W and/or Wo Contrast  Result Date: 04/05/2020 IMPRESSION: 1. No pulmonary  embolus. 2. Small right and large left pleural effusions. 3. Area of consolidation in the peripheral left lower lobe may indicate pneumonia, aspiration or atelectasis. Electronically Signed   By: Deatra RobinsonKevin  Herman M.D.   On: 04/05/2020 01:05    Assessment and Plan:   1.  New onset A. fib with RVR:  -Appears to have had a brief episode of A. fib with RVR upon presentation though is currently in sinus rhythm with PACs versus MAT (difficult to discern with small P waves) -Chronicity is uncertain as he has reported intermittent tachypalpitations for the past 2 weeks -Stop Cardizem drip -Start Lopressor 25 mg twice daily, titrate as needed -CHA2DS2-VASc at least 1 (CHF), though possibly greater pending A1c and BP trends -Will discuss anticoagulation with MD -TSH normal -Potassium and magnesium normal  2.  Acute CHF: -Type uncertain at this time given pending echo -Cannot exclude ischemic heart disease given prolonged tobacco use history as well as a potential tachycardia mediated cardiomyopathy given his A. fib with RVR -If he is found to have a cardiomyopathy he will need diagnostic cardiac cath prior to discharge early next week -Further recommendations regarding his CHF are pending echo -Minimize IV fluids -He continues to appear volume overloaded -Continue IV Lasix with KCl repletion -Daily weights -Strict I's and O's  3.  Elevated troponin: -He has had intermittent chest pain over this past week that is exacerbated with stress  and exertion though is currently chest pain-free -Minimally elevated and peaking at 107 with subsequent delta troponin down trending -Not consistent with ACS -Likely supply demand ischemia in the setting of new onset A. fib with RVR, acute CHF, and CAP -Await echo -Certainly at increased risk for ischemic heart disease given prolonged tobacco use history -Further recommendations regarding ischemic testing pending echo -Should he have a cardiomyopathy or WMA concerning for ischemia he will need to undergo diagnostic cardiac cath prior to discharge early next week  4.  CAP: -Management per IM -Consider changing albuterol to Xopenex to decrease tachypalpitations, defer to IM  5.  Tobacco use: -Complete cessation is advised   6.  Hyperglycemia: -Add A1c for further risk stratification  7.  Elevated BP without diagnosis of HTN: -Continue to monitor BP trends -Add Lopressor as above -Nitropaste, look to DC over the next 12 to 24 hours   For questions or updates, please contact [facility] Please consult [email or web address] for contact info under Cardiology/STEMI.   Signed, Eula Listenyan Dezmen Alcock, PA-C [facility] Pager: [phone] 04/05/2020, 10:03 AM

## 2020-04-05 NOTE — H&P (Signed)
[place]   PATIENT NAME: Raymond Harvey    MR#:  [id number]  DATE OF BIRTH:  [birth year]  DATE OF ADMISSION:  April 17, 2020  PRIMARY CARE PHYSICIAN: Patient, No Pcp Per   REQUESTING/REFERRING PHYSICIAN: Delton Prairie, MD  CHIEF COMPLAINT:   Chief Complaint  Patient presents with  . Back Pain    HISTORY OF PRESENT ILLNESS:  Raymond Harvey  is a 59 y.o. Caucasian male with a known history of ongoing tobacco abuse, who has not seen a physician in more than 10 years and presented to the emergency room with acute onset of worsening dyspnea with associated congested cough with inability to expectorate as well as wheezing over the last couple of days.  He admitted to back pain and left parasternal chest pain with cough as well as palpitations.  He denies any lower extremity edema.  Is been having dyspnea on exertion without significant orthopnea or paroxysmal nocturnal dyspnea.  No dysuria, oliguria or hematuria or flank pain.  Denies any fever or chills.  He has not been vaccinated against COVID-19.  Upon presentation to the emergency room, labs revealed mild cytosis of 11.7 and BMP revealed blood glucose of 167 with a creatinine 1.22 magnesium 1.7.  BNP was significantly elevated at three 879.9 and high-sensitivity troponin I 107 and later 91.  Lactic acid was 3 and later 6.3.  Influenza antigens and COVID-19 PCR came back negative.  Blood culture was drawn. EKG showed atrial fibrillation with RVR of 160 with premature ventricular or aberrantly conducted complexes, left axis deviation.  Chest x-ray showed bilateral pleural effusions left greater than right and adjacent opacity that could reflect combination of atelectasis and underlying airspace disease with recommendation for follow-up in 6 to 8 weeks.  The patient was given IV Rocephin and Zithromax, 20 mg of IV diltiazem followed by IV diltiazem drip, 500 mL IV lactated Ringer followed 150 mL/h and 0.5 mg of p.o. Ativan.  The patient will be  admitted to a progressive unit bed for further evaluation and management.  PAST MEDICAL HISTORY:  Tobacco abuse.  PAST SURGICAL HISTORY:  Tonsillectomy.  SOCIAL HISTORY:   Social History   Tobacco Use  . Smoking status: Current Some Day Smoker    Types: Cigarettes  . Smokeless tobacco: Never Used  Substance Use Topics  . Alcohol use: Not on file    FAMILY HISTORY:  He denies any familial diseases.  DRUG ALLERGIES:  Not on File.  He denied any known allergies  REVIEW OF SYSTEMS:   ROS As per history of present illness. All pertinent systems were reviewed above. Constitutional, HEENT, cardiovascular, respiratory, GI, GU, musculoskeletal, neuro, psychiatric, endocrine, integumentary and hematologic systems were reviewed and are otherwise negative/unremarkable except for positive findings mentioned above in the HPI.   MEDICATIONS AT HOME:   Prior to Admission medications   Not on File      VITAL SIGNS:  Blood pressure (!) 155/129, pulse 72, temperature 98 F (36.7 C), temperature source Oral, resp. rate (!) 28, height 5\' 8"  (1.727 m), weight 63.5 kg, SpO2 95 %.  PHYSICAL EXAMINATION:  Physical Exam  GENERAL:  59 y.o.-year-old Caucasian male patient lying in the bed with mild respiratory distress with conversational dyspnea. EYES: Pupils equal, round, reactive to light and accommodation. No scleral icterus. Extraocular muscles intact.  HEENT: Head atraumatic, normocephalic. Oropharynx and nasopharynx clear.  NECK:  Supple, no jugular venous distention. No thyroid enlargement, no tenderness.  LUNGS: Diminished bibasal breath sounds with  diminished expiratory airflow and diffuse expiratory wheezes and occasional bibasal rales. CARDIOVASCULAR: Regular rate and rhythm, S1, S2 normal. No murmurs, rubs, or gallops.  ABDOMEN: Soft, nondistended, nontender. Bowel sounds present. No organomegaly or mass.  EXTREMITIES: No pedal edema, cyanosis, or clubbing.  NEUROLOGIC: Cranial  nerves II through XII are intact. Muscle strength 5/5 in all extremities. Sensation intact. Gait not checked.  PSYCHIATRIC: The patient is alert and oriented x 3.  Normal affect and good eye contact. SKIN: No obvious rash, lesion, or ulcer.   LABORATORY PANEL:   CBC Recent Labs  Lab April 10, 2020 2000  WBC 11.7*  HGB 15.1  HCT 43.4  PLT 278   ------------------------------------------------------------------------------------------------------------------  Chemistries  Recent Labs  Lab 2020/04/10 2000  NA 134*  K 3.8  CL 99  CO2 20*  GLUCOSE 167*  BUN 12  CREATININE 1.22  CALCIUM 8.6*  MG 1.7   ------------------------------------------------------------------------------------------------------------------  Cardiac Enzymes No results for input(s): TROPONINI in the last 168 hours. ------------------------------------------------------------------------------------------------------------------  RADIOLOGY:  DG Chest 2 View  Result Date: 04-10-20 CLINICAL DATA:  Cough, shortness of breath EXAM: CHEST - 2 VIEW COMPARISON:  None. FINDINGS: Bilateral pleural effusions, left greater than right with some additional opacity in the left lung base some of which could be atelectatic though more patchy adjacent airspace opacity is suggestive of some underlying consolidation as well. No pneumothorax. Pulmonary vascularity is normally distributed. The cardiomediastinal contours are unremarkable. Degenerative changes are present in the imaged spine and shoulders. No acute osseous or soft tissue abnormality. IMPRESSION: Bilateral pleural effusions, left greater than right. Adjacent opacity could reflect a combination of atelectasis and underlying airspace disease. At minimum, follow-up imaging in 6-8 weeks following therapeutic regimen should be performed to ensure resolution. Electronically Signed   By: Kreg Shropshire M.D.   On: Apr 10, 2020 23:18   CT Angio Chest PE W and/or Wo Contrast  Result  Date: 04/05/2020 CLINICAL DATA:  Pleuritic chest pain EXAM: CT ANGIOGRAPHY CHEST WITH CONTRAST TECHNIQUE: Multidetector CT imaging of the chest was performed using the standard protocol during bolus administration of intravenous contrast. Multiplanar CT image reconstructions and MIPs were obtained to evaluate the vascular anatomy. CONTRAST:  74mL OMNIPAQUE IOHEXOL 350 MG/ML SOLN COMPARISON:  None. FINDINGS: Cardiovascular: Contrast injection is sufficient to demonstrate satisfactory opacification of the pulmonary arteries to the segmental level. There is no pulmonary embolus or evidence of right heart strain. The size of the main pulmonary artery is normal. Heart size is normal, with no pericardial effusion. The course and caliber of the aorta are normal. There is no atherosclerotic calcification. Opacification decreased due to pulmonary arterial phase contrast bolus timing. Mediastinum/Nodes: No mediastinal, hilar or axillary lymphadenopathy. Normal visualized thyroid. Thoracic esophageal course is normal. Lungs/Pleura: Small right and large left pleural effusions. Area of consolidation in the peripheral left lower lobe. Upper Abdomen: Contrast bolus timing is not optimized for evaluation of the abdominal organs. The visualized portions of the organs of the upper abdomen are normal. Musculoskeletal: No chest wall abnormality. No bony spinal canal stenosis. Review of the MIP images confirms the above findings. IMPRESSION: 1. No pulmonary embolus. 2. Small right and large left pleural effusions. 3. Area of consolidation in the peripheral left lower lobe may indicate pneumonia, aspiration or atelectasis. Electronically Signed   By: Deatra Robinson M.D.   On: 04/05/2020 01:05      IMPRESSION AND PLAN:   1.  Atrial fibrillation with rapid ventricular response. -The patient will be admitted to a progressive unit bed. -  He will be continued on IV Cardizem drip. -We will check TSH and optimize his magnesium and  potassium. -His CHA2DS2-VASc score is 1. -2D echo and a cardiology consult to be obtained. -I notified Dr. Lalla Brothers about the patient  2.  Suspected acute CHF likely diastolic but could be secondary to #1. -The patient will be diuresed with IV Lasix. -We will follow serial troponin I's. -2D echo and cardiology consult to be obtained as mentioned above.  3.  Left community-acquired pneumonia possibly associated with pleural effusion with subsequent sepsis and severe sepsis as manifested by tachypnea of 22-28 and tachycardia with a heart rate which has been up to 166 and lactic acid of 6.3. -We will continue the patient on IV Rocephin and Zithromax. -Mucolytic therapy will be provided. -Patient will be on bronchodilator therapy.  4.  Acute bronchospasm possibly with COPD exacerbation. -We will add IV steroids in addition to bronchodilator therapy.  5.  Ongoing tobacco abuse. -She was counseled for smoking cessation and will receive further counseling here.  6.  DVT prophylaxis. -Subcutaneous Lovenox.    All the records are reviewed and case discussed with ED provider. The plan of care was discussed in details with the patient (and family). I answered all questions. The patient agreed to proceed with the above mentioned plan. Further management will depend upon hospital course.   CODE STATUS: Full code  Status is: Inpatient  Remains inpatient appropriate because:Hemodynamically unstable, Ongoing diagnostic testing needed not appropriate for outpatient work up, Unsafe d/c plan, IV treatments appropriate due to intensity of illness or inability to take PO and Inpatient level of care appropriate due to severity of illness   Dispo: The patient is from: Home              Anticipated d/c is to: Home              Anticipated d/c date is: 3 days              Patient currently is not medically stable to d/c.   TOTAL TIME TAKING CARE OF THIS PATIENT: 60 minutes.    Hannah Beat M.D on  04/05/2020 at 2:12 AM  [facility]   From 7 PM-7 AM, contact night-coverage [email or web address]  CC: Primary care physician; Patient, No Pcp Per

## 2020-04-05 NOTE — Sepsis Progress Note (Signed)
Sepsis monitoring complete 

## 2020-04-05 NOTE — ED Notes (Signed)
Patient transported to CT 

## 2020-04-05 NOTE — Plan of Care (Signed)

## 2020-04-05 NOTE — Consult Note (Signed)
ANTICOAGULATION CONSULT NOTE - Initial Consult  Pharmacy Consult for Heparin Drip Indication: chest pain/ACS/STEMI  Not on File  Patient Measurements: Height: 5\' 8"  (172.7 cm) Weight: 63.5 kg (140 lb) IBW/kg (Calculated) : 68.4 Heparin Dosing Weight: 63.5kg  Vital Signs: BP: 107/75 (12/04 1030) Pulse Rate: 52 (12/04 1030)  Labs: Recent Labs    04/16/2020 2000 04/05/20 0045 04/05/20 0341  HGB 15.1  --  14.0  HCT 43.4  --  42.3  PLT 278  --  256  CREATININE 1.22  --   --   TROPONINIHS 107* 91*  --     Estimated Creatinine Clearance: 57.8 mL/min (by C-G formula based on SCr of 1.22 mg/dL).   Medical History: History reviewed. No pertinent past medical history.  Medications:  No PTA anticoagulation of record  Assessment: 59 yo male with history of ongoing tobacco use not seen outpatient in ~ 10 years with elevated BNP, trending troponins.  Baseline APTT and INR pending  Goal of Therapy:  Heparin level 0.3-0.7 units/ml Monitor platelets by anticoagulation protocol: Yes   Plan:  Give 4000 units bolus x 1 Start heparin infusion at 750 units/hr Check anti-Xa level in 6 hours and daily while on heparin Continue to monitor H&H and platelets  66, PharmD, BCPS Clinical Pharmacist 04/05/2020 11:12 AM

## 2020-04-05 NOTE — ED Notes (Signed)
No new orders from MD.

## 2020-04-05 NOTE — Progress Notes (Signed)
PROGRESS NOTE    Raymond Harvey  QGB:[id number] DOB: [birth year] DOA: 04/09/2020 PCP: Patient, No Pcp Per   Brief Narrative: Taken from H&P. Raymond Harvey  is a 59 y.o. Caucasian male with a known history of ongoing tobacco abuse, who has not seen a physician in more than 10 years and presented to the emergency room with acute onset of worsening dyspnea with associated congested cough with inability to expectorate as well as wheezing over the last couple of days.  He admitted to back pain and left parasternal chest pain with cough as well as palpitations.  He denies any lower extremity edema.  Is been having dyspnea on exertion without significant orthopnea or paroxysmal nocturnal dyspnea.  No dysuria, oliguria or hematuria or flank pain.  Denies any fever or chills.  He has not been vaccinated against COVID-19. On arrival he was hypertensive, COVID-19 PCR negative, EKG with A. fib and RVR, chest x-ray with bilateral pleural effusions left greater than right with adjacent left lower base opacity with recommendations to follow-up in 6 to 8 weeks. Echocardiogram with EF less than 20% with biventricular failure.  Cardiology was consulted.  Subjective: Patient was feeling better when seen today.  Continues to have some cough, stating that shortness of breath is improving with Lasix.  He thinks that he had hypertension for a long time but has not seen a physician due to being uninsured and unable to afford.  Assessment & Plan:   Active Problems:   CAP (community acquired pneumonia)  New onset HFrEF.  Echocardiogram with severely reduced EF of less than 20%, reduced right and left ventricle systolic function,  global hypokinesia, concern of apical thrombus.  Cardiology was consulted and they are recommending right and left cardiac catheterization on Monday.  Patient is concerned about the cost. -Continue with IV Lasix. -He was started on metoprolol by cardiology. -Daily weight and BMP -Strict intake and  output  A. fib with RVR.  Rate getting better on Cardizem infusion. He was also started on metoprolol with a plan to wean off from Cardizem. He was also started on heparin infusion for a possible apical thrombus.  Hypertension.  Currently blood pressure well controlled with Cardizem and IV Lasix. He will need ACE inhibitor or ARB for optimization of his newly diagnosed HFrEF.  Lactic acidosis.  Persistent lactic acidosis although some improvement.  Most likely with decreased perfusion secondary to severely reduced EF. Might be due to sepsis with pneumonia. -Continue to monitor.  Sepsis with left lower lobe pneumonia.  Patient met sepsis criteria on admission.  Chest x-ray and CTA concerning for left lower lobe pneumonia.  Procalcitonin at 0.42. -Continue ceftriaxone and Zithromax. -Patient will need a repeat imaging in 4 to 6 weeks. -Continue with supportive care.  Bilateral pleural effusion.  Most likely secondary to heart failure. -We will monitor with serial chest x-rays if no improvement then we will consider thoracentesis.  Objective: Vitals:   04/05/20 1315 04/05/20 1330 04/05/20 1345 04/05/20 1416  BP: 110/85 (!) 131/100 (!) 110/97 115/76  Pulse: 63 (!) 104 (!) 102 (!) 107  Resp: $Remo'17 18 18   'TEoNm$ Temp:      TempSrc:      SpO2: 94% 94% 99%   Weight:      Height:        Intake/Output Summary (Last 24 hours) at 04/05/2020 1510 Last data filed at 04/05/2020 1059 Gross per 24 hour  Intake 65.17 ml  Output 700 ml  Net -634.83 ml  Filed Weights   04/28/2020 1950  Weight: 63.5 kg    Examination:  General exam: Appears calm and comfortable  Respiratory system: Mildly decreased breath sounds at left base, respiratory effort normal. Cardiovascular system: S1 & S2 heard, RRR. Gastrointestinal system: Soft, nontender, nondistended, bowel sounds positive. Central nervous system: Alert and oriented. No focal neurological deficits.Symmetric 5 x 5 power. Extremities: No edema, no  cyanosis, pulses intact and symmetrical. Skin: No rashes, lesions or ulcers Psychiatry: Judgement and insight appear normal. Mood & affect appropriate.    DVT prophylaxis: Heparin Code Status: Full Family Communication: Discussed with patient Disposition Plan:  Status is: Inpatient  Remains inpatient appropriate because:Inpatient level of care appropriate due to severity of illness   Dispo: The patient is from: Home              Anticipated d/c is to: Home              Anticipated d/c date is: 2 days              Patient currently is not medically stable to d/c.  Consultants:   Cardiology  Procedures:  Antimicrobials:  Ceftriaxone Zithromax  Data Reviewed: I have personally reviewed following labs and imaging studies  CBC: Recent Labs  Lab 04/30/2020 2000 04/05/20 0341  WBC 11.7* 12.6*  HGB 15.1 14.0  HCT 43.4 42.3  MCV 96.0 98.4  PLT 278 536   Basic Metabolic Panel: Recent Labs  Lab 04/30/2020 2000  NA 134*  K 3.8  CL 99  CO2 20*  GLUCOSE 167*  BUN 12  CREATININE 1.22  CALCIUM 8.6*  MG 1.7   GFR: Estimated Creatinine Clearance: 57.8 mL/min (by C-G formula based on SCr of 1.22 mg/dL). Liver Function Tests: No results for input(s): AST, ALT, ALKPHOS, BILITOT, PROT, ALBUMIN in the last 168 hours. No results for input(s): LIPASE, AMYLASE in the last 168 hours. No results for input(s): AMMONIA in the last 168 hours. Coagulation Profile: Recent Labs  Lab 04/05/20 1146  INR 1.4*   Cardiac Enzymes: No results for input(s): CKTOTAL, CKMB, CKMBINDEX, TROPONINI in the last 168 hours. BNP (last 3 results) No results for input(s): PROBNP in the last 8760 hours. HbA1C: Recent Labs    04/05/20 0341  HGBA1C 6.2*   CBG: No results for input(s): GLUCAP in the last 168 hours. Lipid Profile: Recent Labs    04/05/20 0052  CHOL 114  HDL 23*  LDLCALC 78  TRIG 63  CHOLHDL 5.0   Thyroid Function Tests: Recent Labs    04/05/20 0341  TSH 3.354   Anemia  Panel: No results for input(s): VITAMINB12, FOLATE, FERRITIN, TIBC, IRON, RETICCTPCT in the last 72 hours. Sepsis Labs: Recent Labs  Lab 04/05/20 0459 04/05/20 1443 04/05/20 0806 04/05/20 1100  PROCALCITON  --   --  0.42  --   LATICACIDVEN 3.5* 2.8* 2.6* 2.8*    Recent Results (from the past 240 hour(s))  Resp Panel by RT-PCR (Flu A&B, Covid) Nasopharyngeal Swab     Status: None   Collection Time: 04/05/20 12:45 AM   Specimen: Nasopharyngeal Swab; Nasopharyngeal(NP) swabs in vial transport medium  Result Value Ref Range Status   SARS Coronavirus 2 by RT PCR NEGATIVE NEGATIVE Final    Comment: (NOTE) SARS-CoV-2 target nucleic acids are NOT DETECTED.  The SARS-CoV-2 RNA is generally detectable in upper respiratory specimens during the acute phase of infection. The lowest concentration of SARS-CoV-2 viral copies this assay can detect is 138 copies/mL. A negative  result does not preclude SARS-Cov-2 infection and should not be used as the sole basis for treatment or other patient management decisions. A negative result may occur with  improper specimen collection/handling, submission of specimen other than nasopharyngeal swab, presence of viral mutation(s) within the areas targeted by this assay, and inadequate number of viral copies(<138 copies/mL). A negative result must be combined with clinical observations, patient history, and epidemiological information. The expected result is Negative.  Fact Sheet for Patients:  EntrepreneurPulse.com.au  Fact Sheet for Healthcare Providers:  IncredibleEmployment.be  This test is no t yet approved or cleared by the [place] FDA and  has been authorized for detection and/or diagnosis of SARS-CoV-2 by FDA under an Emergency Use Authorization (EUA). This EUA will remain  in effect (meaning this test can be used) for the duration of the COVID-19 declaration under Section 564(b)(1) of the Act,  21 U.S.C.section 360bbb-3(b)(1), unless the authorization is terminated  or revoked sooner.       Influenza A by PCR NEGATIVE NEGATIVE Final   Influenza B by PCR NEGATIVE NEGATIVE Final    Comment: (NOTE) The Xpert Xpress SARS-CoV-2/FLU/RSV plus assay is intended as an aid in the diagnosis of influenza from Nasopharyngeal swab specimens and should not be used as a sole basis for treatment. Nasal washings and aspirates are unacceptable for Xpert Xpress SARS-CoV-2/FLU/RSV testing.  Fact Sheet for Patients: EntrepreneurPulse.com.au  Fact Sheet for Healthcare Providers: IncredibleEmployment.be  This test is not yet approved or cleared by the [place] FDA and has been authorized for detection and/or diagnosis of SARS-CoV-2 by FDA under an Emergency Use Authorization (EUA). This EUA will remain in effect (meaning this test can be used) for the duration of the COVID-19 declaration under Section 564(b)(1) of the Act, 21 U.S.C. section 360bbb-3(b)(1), unless the authorization is terminated or revoked.  Performed at [facility], [address] [place], [place] [address]   Culture, blood (single)     Status: None (Preliminary result)   Collection Time: 04/05/20 12:45 AM   Specimen: BLOOD  Result Value Ref Range Status   Specimen Description BLOOD RIGHT FOREARM  Final   Special Requests   Final    BOTTLES DRAWN AEROBIC AND ANAEROBIC Blood Culture results may not be optimal due to an excessive volume of blood received in culture bottles   Culture   Final    NO GROWTH < 12 HOURS Performed at [facility], [address], [place], [place] [address]    Report Status PENDING  Incomplete     Radiology Studies: DG Chest 2 View  Result Date: 04/20/2020 CLINICAL DATA:  Cough, shortness of breath EXAM: CHEST - 2 VIEW COMPARISON:  None. FINDINGS: Bilateral pleural effusions, left greater than right with some additional opacity in  the left lung base some of which could be atelectatic though more patchy adjacent airspace opacity is suggestive of some underlying consolidation as well. No pneumothorax. Pulmonary vascularity is normally distributed. The cardiomediastinal contours are unremarkable. Degenerative changes are present in the imaged spine and shoulders. No acute osseous or soft tissue abnormality. IMPRESSION: Bilateral pleural effusions, left greater than right. Adjacent opacity could reflect a combination of atelectasis and underlying airspace disease. At minimum, follow-up imaging in 6-8 weeks following therapeutic regimen should be performed to ensure resolution. Electronically Signed   By: Lovena Le M.D.   On: 04/30/2020 23:18   CT Angio Chest PE W and/or Wo Contrast  Result Date: 04/05/2020 CLINICAL DATA:  Pleuritic chest pain EXAM:  CT ANGIOGRAPHY CHEST WITH CONTRAST TECHNIQUE: Multidetector CT imaging of the chest was performed using the standard protocol during bolus administration of intravenous contrast. Multiplanar CT image reconstructions and MIPs were obtained to evaluate the vascular anatomy. CONTRAST:  50mL OMNIPAQUE IOHEXOL 350 MG/ML SOLN COMPARISON:  None. FINDINGS: Cardiovascular: Contrast injection is sufficient to demonstrate satisfactory opacification of the pulmonary arteries to the segmental level. There is no pulmonary embolus or evidence of right heart strain. The size of the main pulmonary artery is normal. Heart size is normal, with no pericardial effusion. The course and caliber of the aorta are normal. There is no atherosclerotic calcification. Opacification decreased due to pulmonary arterial phase contrast bolus timing. Mediastinum/Nodes: No mediastinal, hilar or axillary lymphadenopathy. Normal visualized thyroid. Thoracic esophageal course is normal. Lungs/Pleura: Small right and large left pleural effusions. Area of consolidation in the peripheral left lower lobe. Upper Abdomen: Contrast bolus  timing is not optimized for evaluation of the abdominal organs. The visualized portions of the organs of the upper abdomen are normal. Musculoskeletal: No chest wall abnormality. No bony spinal canal stenosis. Review of the MIP images confirms the above findings. IMPRESSION: 1. No pulmonary embolus. 2. Small right and large left pleural effusions. 3. Area of consolidation in the peripheral left lower lobe may indicate pneumonia, aspiration or atelectasis. Electronically Signed   By: Ulyses Jarred M.D.   On: 04/05/2020 01:05   ECHOCARDIOGRAM COMPLETE  Result Date: 04/05/2020    ECHOCARDIOGRAM REPORT   Patient Name:   JORAH HUA Date of Exam: 04/05/2020 Medical Rec #:  [id number]    Height:       68.0 in Accession #:    [id number]   Weight:       140.0 lb Date of Birth:  1960/05/29    BSA:          1.756 m Patient Age:    67 years     BP:           116/88 mmHg Patient Gender: M            HR:           82 bpm. Exam Location:  [facility] Procedure: 2D Echo Indications:     Atrial Fibrillation 427.31 / I48.91  History:         Patient has no prior history of Echocardiogram examinations.                  Risk Factors:Hypertension and Current Smoker.  Sonographer:     Avanell Shackleton Referring Phys:  [id number] [place] Diagnosing Phys: Kate Sable MD IMPRESSIONS  1. Left ventricular ejection fraction, by estimation, is <20%. The left ventricle has severely decreased function. The left ventricle demonstrates global hypokinesis. The left ventricular internal cavity size was mildly to moderately dilated. Left ventricular diastolic parameters are indeterminate.  2. Right ventricular systolic function is severely reduced. The right ventricular size is normal. There is severely elevated pulmonary artery systolic pressure.  3. Left atrial size was severely dilated.  4. Right atrial size was mildly dilated.  5. Large pleural effusion in the left lateral region.  6. The mitral valve is normal in structure. Mild mitral valve  regurgitation.  7. The aortic valve is tricuspid. Aortic valve regurgitation is not visualized.  8. The inferior vena cava is dilated in size with <50% respiratory variability, suggesting right atrial pressure of 15 mmHg. Conclusion(s)/Recommendation(s): LV appex appears trabecualted, cannot r/o apical thrombus. recommend limited echo with contrast to evaluate  presence of apical thrombus. FINDINGS  Left Ventricle: LV appex appears trabecualted, cannot r/o apical thrombus. recommend limited echo with contrast to evaluate apical thrombus. Left ventricular ejection fraction, by estimation, is <20%. The left ventricle has severely decreased function. The left ventricle demonstrates global hypokinesis. The left ventricular internal cavity size was mildly to moderately dilated. There is no left ventricular hypertrophy. Left ventricular diastolic parameters are indeterminate. Right Ventricle: The right ventricular size is normal. No increase in right ventricular wall thickness. Right ventricular systolic function is severely reduced. There is severely elevated pulmonary artery systolic pressure. The tricuspid regurgitant velocity is 3.83 m/s, and with an assumed right atrial pressure of 15 mmHg, the estimated right ventricular systolic pressure is 27.7 mmHg. Left Atrium: Left atrial size was severely dilated. Right Atrium: Right atrial size was mildly dilated. Pericardium: There is no evidence of pericardial effusion. Mitral Valve: The mitral valve is normal in structure. Mild mitral valve regurgitation. Tricuspid Valve: The tricuspid valve is grossly normal. Tricuspid valve regurgitation is mild. Aortic Valve: The aortic valve is tricuspid. Aortic valve regurgitation is not visualized. Pulmonic Valve: The pulmonic valve was normal in structure. Pulmonic valve regurgitation is not visualized. Aorta: The aortic root is normal in size and structure. Venous: The inferior vena cava is dilated in size with less than 50%  respiratory variability, suggesting right atrial pressure of 15 mmHg. IAS/Shunts: No atrial level shunt detected by color flow Doppler. Additional Comments: There is a large pleural effusion in the left lateral region.  LEFT VENTRICLE PLAX 2D LVIDd:         6.29 cm LVIDs:         5.74 cm LV PW:         0.66 cm LV IVS:        0.87 cm LVOT diam:     1.90 cm LVOT Area:     2.84 cm  LV Volumes (MOD) LV vol d, MOD A2C: 175.0 ml LV vol d, MOD A4C: 157.0 ml LV vol s, MOD A2C: 152.0 ml LV vol s, MOD A4C: 136.0 ml LV SV MOD A2C:     23.0 ml LV SV MOD A4C:     157.0 ml LV SV MOD BP:      28.2 ml IVC IVC diam: 2.49 cm LEFT ATRIUM             Index       RIGHT ATRIUM           Index LA diam:        5.10 cm 2.90 cm/m  RA Area:     18.70 cm LA Vol (A2C):   90.4 ml 51.47 ml/m RA Volume:   60.40 ml  34.39 ml/m LA Vol (A4C):   77.2 ml 43.96 ml/m LA Biplane Vol: 89.4 ml 50.90 ml/m   AORTA Ao Root diam: 3.10 cm TRICUSPID VALVE TR Peak grad:   58.7 mmHg TR Vmax:        383.00 cm/s  SHUNTS Systemic Diam: 1.90 cm Kate Sable MD Electronically signed by Kate Sable MD Signature Date/Time: 04/05/2020/12:28:05 PM    Final     Scheduled Meds: . aspirin EC  81 mg Oral Daily  . furosemide  40 mg Intravenous Q12H  . guaiFENesin  600 mg Oral BID  . methylPREDNISolone (SOLU-MEDROL) injection  40 mg Intravenous Q8H  . metoprolol tartrate  25 mg Oral QID  . nitroGLYCERIN  1 inch Topical Q6H   Continuous Infusions: . azithromycin Stopped (04/05/20 0300)  .  cefTRIAXone (ROCEPHIN)  IV Stopped (04/05/20 0143)  . heparin 750 Units/hr (04/05/20 1143)     LOS: 0 days   Time spent: 40 minutes.  Lorella Nimrod, MD [facility]  If 7PM-7AM, please contact night-coverage [email or web address]  04/05/2020, 3:10 PM   This record has been created using Systems analyst. Errors have been sought and corrected,but may not always be located. Such creation errors do not reflect on the standard of care.

## 2020-04-05 NOTE — ED Notes (Signed)
Admit MD paged.  Pt c/o back pain 8/10, says it feels like spasms. Gave PRN acetaminophen, could I get an order for something stronger if that doesn't help?

## 2020-04-05 NOTE — ED Notes (Signed)
Patient CO nausea.  Waiting on aspirin until order can be given for nausea issue

## 2020-04-06 ENCOUNTER — Other Ambulatory Visit: Payer: Self-pay

## 2020-04-06 ENCOUNTER — Inpatient Hospital Stay: Payer: Self-pay

## 2020-04-06 DIAGNOSIS — I469 Cardiac arrest, cause unspecified: Secondary | ICD-10-CM

## 2020-04-06 DIAGNOSIS — J9600 Acute respiratory failure, unspecified whether with hypoxia or hypercapnia: Secondary | ICD-10-CM

## 2020-04-06 LAB — CBC WITH DIFFERENTIAL/PLATELET
Abs Immature Granulocytes: 0.52 10*3/uL — ABNORMAL HIGH (ref 0.00–0.07)
Basophils Absolute: 0.1 10*3/uL (ref 0.0–0.1)
Basophils Relative: 0 %
Eosinophils Absolute: 0 10*3/uL (ref 0.0–0.5)
Eosinophils Relative: 0 %
HCT: 43.2 % (ref 39.0–52.0)
Hemoglobin: 13.1 g/dL (ref 13.0–17.0)
Immature Granulocytes: 3 %
Lymphocytes Relative: 12 %
Lymphs Abs: 2.3 10*3/uL (ref 0.7–4.0)
MCH: 33 pg (ref 26.0–34.0)
MCHC: 30.3 g/dL (ref 30.0–36.0)
MCV: 108.8 fL — ABNORMAL HIGH (ref 80.0–100.0)
Monocytes Absolute: 1 10*3/uL (ref 0.1–1.0)
Monocytes Relative: 5 %
Neutro Abs: 15.2 10*3/uL — ABNORMAL HIGH (ref 1.7–7.7)
Neutrophils Relative %: 80 %
Platelets: 180 10*3/uL (ref 150–400)
RBC: 3.97 MIL/uL — ABNORMAL LOW (ref 4.22–5.81)
RDW: 14.8 % (ref 11.5–15.5)
WBC: 19 10*3/uL — ABNORMAL HIGH (ref 4.0–10.5)
nRBC: 0.6 % — ABNORMAL HIGH (ref 0.0–0.2)

## 2020-04-06 LAB — TROPONIN I (HIGH SENSITIVITY): Troponin I (High Sensitivity): 287 ng/L (ref <18)

## 2020-04-06 LAB — COMPREHENSIVE METABOLIC PANEL
ALT: 997 U/L — ABNORMAL HIGH (ref 0–44)
AST: 1532 U/L — ABNORMAL HIGH (ref 15–41)
Albumin: 2.1 g/dL — ABNORMAL LOW (ref 3.5–5.0)
Alkaline Phosphatase: 200 U/L — ABNORMAL HIGH (ref 38–126)
Anion gap: 20 — ABNORMAL HIGH (ref 5–15)
BUN: 29 mg/dL — ABNORMAL HIGH (ref 6–20)
CO2: 12 mmol/L — ABNORMAL LOW (ref 22–32)
Calcium: 9.1 mg/dL (ref 8.9–10.3)
Chloride: 101 mmol/L (ref 98–111)
Creatinine, Ser: 2.26 mg/dL — ABNORMAL HIGH (ref 0.61–1.24)
GFR, Estimated: 32 mL/min — ABNORMAL LOW (ref >60)
Glucose, Bld: 191 mg/dL — ABNORMAL HIGH (ref 70–99)
Potassium: 4.4 mmol/L (ref 3.5–5.1)
Sodium: 133 mmol/L — ABNORMAL LOW (ref 135–145)
Total Bilirubin: 1.9 mg/dL — ABNORMAL HIGH (ref 0.3–1.2)
Total Protein: 5.5 g/dL — ABNORMAL LOW (ref 6.5–8.1)

## 2020-04-06 LAB — MAGNESIUM: Magnesium: 3.9 mg/dL — ABNORMAL HIGH (ref 1.7–2.4)

## 2020-04-06 LAB — HEPARIN LEVEL (UNFRACTIONATED): Heparin Unfractionated: 0.12 IU/mL — ABNORMAL LOW (ref 0.30–0.70)

## 2020-04-06 LAB — GLUCOSE, CAPILLARY: Glucose-Capillary: 177 mg/dL — ABNORMAL HIGH (ref 70–99)

## 2020-04-06 LAB — PHOSPHORUS: Phosphorus: 7.7 mg/dL — ABNORMAL HIGH (ref 2.5–4.6)

## 2020-04-06 MED ORDER — HEPARIN BOLUS VIA INFUSION
1000.0000 [IU] | Freq: Once | INTRAVENOUS | Status: DC
  Filled 2020-04-06: qty 1000

## 2020-04-06 MED ORDER — EPINEPHRINE HCL 5 MG/250ML IV SOLN IN NS
0.5000 ug/min | INTRAVENOUS | Status: DC
  Filled 2020-04-06: qty 250

## 2020-04-06 MED ORDER — SODIUM BICARBONATE 8.4 % IV SOLN
INTRAVENOUS | Status: AC
  Filled 2020-04-06: qty 50

## 2020-04-06 MED ORDER — HEPARIN BOLUS VIA INFUSION
1900.0000 [IU] | Freq: Once | INTRAVENOUS | Status: DC
  Filled 2020-04-06: qty 1900

## 2020-04-06 MED FILL — Medication: Qty: 1 | Status: AC

## 2020-04-07 SURGERY — RIGHT/LEFT HEART CATH AND CORONARY ANGIOGRAPHY
Anesthesia: Moderate Sedation

## 2020-04-08 LAB — LEGIONELLA PNEUMOPHILA SEROGP 1 UR AG: L. pneumophila Serogp 1 Ur Ag: NEGATIVE

## 2020-04-10 LAB — CULTURE, BLOOD (SINGLE): Culture: NO GROWTH

## 2020-05-03 NOTE — Progress Notes (Signed)
CDS [id number] c/o [employer]. Pt for potential eye and tissue donation. To call CDS once communication with [facility] is establihed. Eye prep done.

## 2020-05-03 NOTE — Progress Notes (Signed)
Pt paged for nurse, nurse was in a dressing change so fellow nurses went to check on him. Nurse reported he was purple, sitting edge of bed. Became rigid moments after and code blue was called. When nurse came out of dressing change, code was in progress. Able to give update to MD. Pt BP soft since beginning of shift (80s-90s systolic) Pt had complaints of heartburn. Given TUMS at beginning of shift. Complained of heartburn a few hours later. Ordered IV protonix & MAALOX. Doubled checked with pt to make sure it was heartburn and not c/p. He confirmed being he has this often. No other complaints during shift. Pt contact couldn't be reached at this time for update. Pulse returned and Pt transferred to ICU.

## 2020-05-03 NOTE — Progress Notes (Signed)
Patient received post CODE blue (see code blue sheet) additional code x 2, patient pronounced [id number], no family available at this time, donor services notified.

## 2020-05-03 NOTE — Code Documentation (Signed)
Critical Care Code Note  The patient was apparently found unresponsive, cyanotic with no pulse on telemetry unit.   00:50 code blue called, and CPR begun 01:14 intubated with 7.5 ETT by ED doc Peripheral IV placed, I/O placed Patient received Epi 1mg  x 4 doses, Atropine 1mg  x2 Calcium chloride 1g x 1 Bicarb 1 amp NS bolus wide open ROSC achieved -- sinus tachy and PACs, subsequently transferred to ICU  -- see separate code blue note by hospitalist Dr. who led the code on telemetry-------   -The patient arrived to ICU, Rm 20, and had MAP of 110, initially HR in the 70-80s, then became bradycardic to ~40.  He was given Atropine 0.5mg  IV, repeat Atropine 0.5mg  IV was given with improvement of HR into the 80s. -Pacer pads was applied, and Epi drip started. -After a few minutes, MAP dropped to 77, and HR again dropped into the 30s.  Atropine 1mg  IV given but became pulseless, and CPR immediately started, overall just a few minutes after the patient arrived from telemetry to ICU Rm 20 -He was in Asystole -He achieved ROSC, and became pulseless again after a few minutes -He was continued on Epi drip , increased to Valente David then during 2 sets of CPR in the ICU -rhythm were both Asystole and PEA, no shockable rhythm -Bicarb x3 given -Calcium chloride x2 given -Called emergency contact x 3 with no answer - at 02:18, code was called  Total CPR time: off and on x 1 hr, 20 mins (including telemetry + ICU)  Of note, 2 attempts to place A-line in the left femoral artery unsuccessful.  Bedside cardiac ultrasound performed while with pulse, and showed severe global systolic dysfunction, EF appears < 20%.   Critical care time 65 minutes.  ___________________  , MD [facility]

## 2020-05-03 NOTE — Progress Notes (Signed)
  Critical Care Note The patient's girlfriend Jimmy Footman called the unit.  I spoke with her and informed her of the event.  She states the patient has no children, is not married, has no siblings and both his parents passed away.  She does not have POA.  She is only aware of a niece, nephew and auntie that he has.  She states she can be reached at [phone].  I answered all of her questions.   ___________________ Drue Stager Coralie Common, MD [facility]

## 2020-05-03 NOTE — Progress Notes (Addendum)
eLink Physician-Brief Progress Note Patient Name: Raymond Harvey DOB: 1960/12/05 MRN: [id number]   Date of Service  04/28/2020  HPI/Events of Note  Brief new admit note: Transferred from floor post almost 30 mins ACLS , PEA arrest with ROSC. in ICU with epi and heparin gtt.  59 y.o. Caucasian male with a known history of ongoing tobacco abuse, who has not seen a physician in more than 10 years and presented to the emergency room with acute onset of worsening dyspnea with associated congested cough with inability to expectorate as well as wheezing over the last couple of days.  He admitted to back pain and left parasternal chest pain with cough as well as palpitations.  COVID-19 PCR negative, EKG with A. fib and RVR, chest x-ray with bilateral pleural effusions left greater than right with adjacent left lower base opacity, Echocardiogram with EF less than 20% with biventricular failure. admitted on 4 th noon for CAP, new onset a fib, CHF.     Camera: on Vent.   Data: Covid/flu was neg. T wave down from V3 to V6, qtc 490. incomplete RBBB. Q wave in lead 3. CTA chest yesterday: IMPRESSION: 1. No pulmonary embolus. 2. Small right and large left pleural effusions. 3. Area of consolidation in the peripheral left lower lobe may indicate pneumonia, aspiration or atelectasis.    A/P 1. Cardiac arrest-PEA. Cardiogenic shock. Low EF, a fib. smoker.no PE. - on epi, bicarb and on Vent. heparin gtt. - trend troponin.   2. CAP: LLL Pneumonia on scan. - trend LA, on antibiotics.     eICU Interventions  NOTE:  In ICU, code was stopped. Notes are pending from bed side doc/RN.       Intervention Category Major Interventions: Other:;Respiratory failure - evaluation and management;Shock - evaluation and management Evaluation Type: New Patient Evaluation  Ranee Gosselin 04/03/2020, 2:35 AM

## 2020-05-03 NOTE — Code Documentation (Addendum)
Date of CODE BLUE: 2020-04-18  The patient was having heartburn earlier this evening around 10:45 PM.  Was given Maalox, Tums and was ordered IV Protonix.  At 00: 50 on 04/18/2020, he was found unresponsive and was in asystole on monitor and pulseless.  CODE BLUE was called immediately.  CPR was immediately started.  The patient was bagged.  Initial IV epinephrine 1 mg was given and then the patient lost his IV.  Another IV was established in his left hand and later and IO line was inserted in the right tibia.  The patient had vomiting in the course of bagging and was suctioned.  OG tube was attempted inserted.  7.76F ET tube was placed by Dr. Don Perking at 23 cm with good color change.  During the course of the code the patient received the following: -1 mg of IV epinephrine for a total of 4 doses -Atropine 1 mg twice -An ampule of calcium chloride 1 g once -An amp of sodium bicarbonate -Wide-open IV normal saline.  He regained pulse with sinus tachycardia and PACs at 01: 28.  He was ordered IV epinephrine drip and transferred to the ICU.  I attempted to call available phone number on his chart with no answer.  Addendum: Ms. Juluis Pitch the patient's father's girlfriend called me back at 6:19.  She stated that she was asleep and therefore was not able to answer the phone which was the only available form for the patient that I called.  I notified her of what happened in the codes and later on what happened in the ICU and that the patient was pronounced dead at 02: 18 a.m. by Dr. Coralie Common after further attempts at resuscitation.

## 2020-05-03 NOTE — ED Provider Notes (Signed)
[facility] Department of Emergency Medicine   Code Blue CONSULT NOTE  Chief Complaint: Cardiac arrest/unresponsive   Level V Caveat: Unresponsive  History of present illness: I was contacted by the hospital for a CODE BLUE cardiac arrest upstairs and presented to the patient's bedside.  Upon arrival to the room CPR in progress and Dr. Arville Care at bedside.   ROS: Unable to obtain, Level V caveat  Scheduled Meds: . aspirin EC  81 mg Oral Daily  . furosemide  40 mg Intravenous Q12H  . guaiFENesin  600 mg Oral BID  . heparin  1,900 Units Intravenous Once  . metoprolol tartrate  25 mg Oral QID  . nitroGLYCERIN  1 inch Topical Q6H  . pantoprazole  40 mg Oral Daily  . sodium bicarbonate       Continuous Infusions: . azithromycin 500 mg (04/16/2020 0024)  . cefTRIAXone (ROCEPHIN)  IV 2 g (04/20/2020 0024)  . epinephrine    . heparin 750 Units/hr (04/05/20 1143)   PRN Meds:.acetaminophen **OR** acetaminophen, calcium carbonate, chlorpheniramine-HYDROcodone, magnesium hydroxide, ondansetron **OR** ondansetron (ZOFRAN) IV, traZODone History reviewed. No pertinent past medical history. History reviewed. No pertinent surgical history. Social History   Socioeconomic History  . Marital status: Unknown    Spouse name: Not on file  . Number of children: Not on file  . Years of education: Not on file  . Highest education level: Not on file  Occupational History  . Not on file  Tobacco Use  . Smoking status: Current Some Day Smoker    Types: Cigarettes  . Smokeless tobacco: Never Used  Substance and Sexual Activity  . Alcohol use: Not on file  . Drug use: Not on file  . Sexual activity: Not on file  Other Topics Concern  . Not on file  Social History Narrative  . Not on file   Social Determinants of Health   Financial Resource Strain:   . Difficulty of Paying Living Expenses: Not on file  Food Insecurity:   . Worried About Programme researcher, broadcasting/film/video in the Last Year:  Not on file  . Ran Out of Food in the Last Year: Not on file  Transportation Needs:   . Lack of Transportation (Medical): Not on file  . Lack of Transportation (Non-Medical): Not on file  Physical Activity:   . Days of Exercise per Week: Not on file  . Minutes of Exercise per Session: Not on file  Stress:   . Feeling of Stress : Not on file  Social Connections:   . Frequency of Communication with Friends and Family: Not on file  . Frequency of Social Gatherings with Friends and Family: Not on file  . Attends Religious Services: Not on file  . Active Member of Clubs or Organizations: Not on file  . Attends Banker Meetings: Not on file  . Marital Status: Not on file  Intimate Partner Violence:   . Fear of Current or Ex-Partner: Not on file  . Emotionally Abused: Not on file  . Physically Abused: Not on file  . Sexually Abused: Not on file   Not on File  Last set of Vital Signs (not current) Vitals:   04/05/20 2143 04/23/2020 0212  BP: 96/80   Pulse: 67   Resp:    Temp:    SpO2:  (!) 84%      Physical Exam  Gen: unresponsive Cardiovascular: pulseless  Resp: apneic. Breath sounds equal bilaterally with bagging  Abd: nondistended  Neuro:  GCS 3, unresponsive to pain  HEENT: No blood in posterior pharynx, gag reflex absent  Neck: No crepitus  Musculoskeletal: No deformity  Skin: warm  Procedures  INTUBATION Performed by: Nita Sickle Required items: required blood products, implants, devices, and special equipment available Patient identity confirmed: provided demographic data and hospital-assigned identification number Time out: Immediately prior to procedure a "time out" was called to verify the correct patient, procedure, equipment, support staff and site/side marked as required. Indications: cardiac arrest Intubation method: Video laryngoscopy Preoxygenation: BVM Sedatives: none Paralytic: none Tube Size: 7.5 cuffed Post-procedure assessment:  chest rise and ETCO2 monitor Breath sounds: equal and absent over the epigastrium Tube secured by Respiratory Therapy Patient tolerated the procedure well with no immediate complications.   INTRA-OSSEOUS ACCESS Performed by: Nita Sickle, MD Indication: cardiac arrest, difficult access Location: L tibia Consent: Emergent Patient prepped and IO placed with return of blood when aspirated, flushing easily.   OG TUBE PLACEMENT Performed: Nita Sickle, MD Indication: Cardiac arrest, aspiration Placement confirmed by auscultation and suction of gastric contents Consent: Emergent    CRITICAL CARE Performed by: Nita Sickle Total critical care time: 40 min Critical care time was exclusive of separately billable procedures and treating other patients. Critical care was necessary to treat or prevent imminent or life-threatening deterioration. Critical care was time spent personally by me on the following activities: development of treatment plan with patient and/or surrogate as well as nursing, discussions with consultants, evaluation of patient's response to treatment, examination of patient, obtaining history from patient or surrogate, ordering and performing treatments and interventions, ordering and review of laboratory studies, ordering and review of radiographic studies, pulse oximetry and re-evaluation of patient's condition.  Cardiopulmonary Resuscitation (CPR) Procedure Note  Directed/Performed by: Nita Sickle I personally directed ancillary staff and/or performed CPR in an effort to regain return of spontaneous circulation and to maintain cardiac, neuro and systemic perfusion.    Assessment and Plan  43M cardiac arrest on the floor.  Upon arrival to the room CPR in progress directed by Dr. Arville Care.  Initial rhythm was asystole.  Patient was intubated by myself per procedure note above.  Patient large amount of emesis and aspiration.  OG tube was placed by me per  procedure note above.  Intraosseous access due to difficult access on the floor.  Patient did not require any sedation or paralytics for intubation.  After several rounds of epi, calcium, magnesium, and atropine patient achieved ROSC but remained with GCS of 3.  CPR was directed by Dr. Arville Care. I was available for consultation and for procedures done above during the entire resuscitation.  Patient was transferred to ICU    Nita Sickle, MD 04/26/2020 [id number]

## 2020-05-03 NOTE — Consult Note (Signed)
ANTICOAGULATION CONSULT NOTE - Initial Consult  Pharmacy Consult for Heparin Drip Indication: chest pain/ACS/STEMI  Not on File  Patient Measurements: Height: 5\' 8"  (172.7 cm) Weight: 63.5 kg (140 lb) IBW/kg (Calculated) : 68.4 Heparin Dosing Weight: 63.5kg  Vital Signs: Temp: 97.6 F (36.4 C) (12/04 1937) Temp Source: Oral (12/04 1937) BP: 96/80 (12/04 2143) Pulse Rate: 67 (12/04 2143)  Labs: Recent Labs    04/10/20 2000 04/05/20 0045 04/05/20 0341 04/05/20 1146 04/05/20 1833 04/14/2020 0001  HGB 15.1  --  14.0  --   --   --   HCT 43.4  --  42.3  --   --   --   PLT 278  --  256  --   --   --   APTT  --   --   --  31  --   --   LABPROT  --   --   --  16.7*  --   --   INR  --   --   --  1.4*  --   --   HEPARINUNFRC  --   --   --   --  0.38 0.12*  CREATININE 1.22  --   --   --   --   --   TROPONINIHS 107* 91*  --   --   --   --     Estimated Creatinine Clearance: 57.8 mL/min (by C-G formula based on SCr of 1.22 mg/dL).   Medical History: History reviewed. No pertinent past medical history.  Medications:  No PTA anticoagulation of record  Assessment: 60 yo male with history of ongoing tobacco use not seen outpatient in ~ 10 years with elevated BNP, trending troponins.  Baseline APTT and INR pending  12/4 18:33 HL 0.38, therapeutic x 1  Goal of Therapy:  Heparin level 0.3-0.7 units/ml Monitor platelets by anticoagulation protocol: Yes   Plan:  12/5:  HL @ 0001 = 0.12 Will order Heparin 1900 units IV X 1 bolus and increase drip rate to 1000 units/hr. Will recheck HL 6 hrs after rate change.   Lamark Schue D 04/09/2020 1:08 AM

## 2020-05-03 NOTE — Death Summary Note (Signed)
DEATH SUMMARY   Patient Details  Name: Raymond Harvey MRN: [id number] DOB: 10/29/1960  Admission/Discharge Information   Admit Date:  2020/04/16  Date of Death: Date of Death: 2020/04/18  Time of Death: Time of Death: 0218  Length of Stay: 1  Referring Physician: Patient, No Pcp Per   Reason(s) for Hospitalization  Worsening SOB  Diagnoses  Preliminary cause of death:  Secondary Diagnoses (including complications and co-morbidities):  Active Problems:   CAP (community acquired pneumonia)   Brief Hospital Course (including significant findings, care, treatment, and services provided and events leading to death)  Bridget Westbrooks is a 60 y.o. year old male who is a known history of ongoing tobacco abuse, who has not seen a physician in more than 10 years and presented to the emergency room with acute onset of worsening dyspnea with associated congested cough with inability to expectorate as well as wheezing over the last couple of days.  He admitted to back pain and left parasternal chest pain with cough as well as palpitations.  COVID-19 PCR negative, EKG with A. fib and RVR, chest x-ray with bilateral pleural effusions left greater than right with adjacent left lower base opacity.  Echocardiogram showed EF less than 20% with biventricular failure. He was admitted with CAP, new onset a fib, CHF.  He was on heparin drip.  CTA chest negative for pulmonary embolus, but showed small right and large left pleural effusions, area of consolidation in the peripheral left lower lobe suggestive of pneumonia, aspiration or atelectasis.  He was admitted with working diagnosis of LLL pneumonia/CAP and was being treated with antibiotics.   ------  The patient was apparently found unresponsive, cyanotic with no pulse on telemetry unit.   00:50 code blue called, and CPR begun 01:14 intubated with 7.5 ETT by ED doc Peripheral IV placed, I/O placed Patient received Epi 1mg  x 4 doses, Atropine 1mg   x2 Calcium chloride 1g x 1 Bicarb 1 amp NS bolus wide open ROSC achieved -- sinus tachy and PACs, subsequently transferred to ICU  -The patient arrived to ICU, Rm 20, and had MAP of 110, initially HR in the 70-80s, then became bradycardic to ~40.  He was given Atropine 0.5mg  IV, repeat Atropine 0.5mg  IV was given with improvement of HR into the 80s. -Pacer pads was applied, and Epi drip started. -After a few minutes, MAP dropped to 77, and HR again dropped into the 30s.  Atropine 1mg  IV given but became pulseless, and CPR immediately started, overall just a few minutes after the patient arrived from telemetry to ICU Rm 20 -He was in Asystole -He achieved ROSC, and became pulseless again after a few minutes -He was continued on Epi drip , increased to then during 2 sets of CPR in the ICU -rhythm were both Asystole and PEA, no shockable rhythm -Bicarb x3 given -Calcium chloride x2 given -Called emergency contact x 3 with no answer - at 02:18, code was called  Total CPR time: off and on x 1 hr, 20 mins (including telemetry + ICU)    Pertinent Labs and Studies  Significant Diagnostic Studies DG Chest 2 View  Result Date: 16-Apr-2020 CLINICAL DATA:  Cough, shortness of breath EXAM: CHEST - 2 VIEW COMPARISON:  None. FINDINGS: Bilateral pleural effusions, left greater than right with some additional opacity in the left lung base some of which could be atelectatic though more patchy adjacent airspace opacity is suggestive of some underlying consolidation as well. No pneumothorax. Pulmonary vascularity is normally  distributed. The cardiomediastinal contours are unremarkable. Degenerative changes are present in the imaged spine and shoulders. No acute osseous or soft tissue abnormality. IMPRESSION: Bilateral pleural effusions, left greater than right. Adjacent opacity could reflect a combination of atelectasis and underlying airspace disease. At minimum, follow-up imaging in 6-8 weeks  following therapeutic regimen should be performed to ensure resolution. Electronically Signed   By: Kreg ShropshirePrice  DeHay M.D.   On: 04/08/2020 23:18   CT Angio Chest PE W and/or Wo Contrast  Result Date: 04/05/2020 CLINICAL DATA:  Pleuritic chest pain EXAM: CT ANGIOGRAPHY CHEST WITH CONTRAST TECHNIQUE: Multidetector CT imaging of the chest was performed using the standard protocol during bolus administration of intravenous contrast. Multiplanar CT image reconstructions and MIPs were obtained to evaluate the vascular anatomy. CONTRAST:  75mL OMNIPAQUE IOHEXOL 350 MG/ML SOLN COMPARISON:  None. FINDINGS: Cardiovascular: Contrast injection is sufficient to demonstrate satisfactory opacification of the pulmonary arteries to the segmental level. There is no pulmonary embolus or evidence of right heart strain. The size of the main pulmonary artery is normal. Heart size is normal, with no pericardial effusion. The course and caliber of the aorta are normal. There is no atherosclerotic calcification. Opacification decreased due to pulmonary arterial phase contrast bolus timing. Mediastinum/Nodes: No mediastinal, hilar or axillary lymphadenopathy. Normal visualized thyroid. Thoracic esophageal course is normal. Lungs/Pleura: Small right and large left pleural effusions. Area of consolidation in the peripheral left lower lobe. Upper Abdomen: Contrast bolus timing is not optimized for evaluation of the abdominal organs. The visualized portions of the organs of the upper abdomen are normal. Musculoskeletal: No chest wall abnormality. No bony spinal canal stenosis. Review of the MIP images confirms the above findings. IMPRESSION: 1. No pulmonary embolus. 2. Small right and large left pleural effusions. 3. Area of consolidation in the peripheral left lower lobe may indicate pneumonia, aspiration or atelectasis. Electronically Signed   By: Deatra RobinsonKevin  Herman M.D.   On: 04/05/2020 01:05   ECHOCARDIOGRAM COMPLETE  Result Date: 04/05/2020     ECHOCARDIOGRAM REPORT   Patient Name:   Raymond DriverRONALD Harvey Date of Exam: 04/05/2020 Medical Rec #:  [id number]    Height:       68.0 in Accession #:    [id number]   Weight:       140.0 lb Date of Birth:  31-Jan-1961    BSA:          1.756 m Patient Age:    59 years     BP:           116/88 mmHg Patient Gender: M            HR:           82 bpm. Exam Location:  [facility] Procedure: 2D Echo Indications:     Atrial Fibrillation 427.31 / I48.91  History:         Patient has no prior history of Echocardiogram examinations.                  Risk Factors:Hypertension and Current Smoker.  Sonographer:     Johnathan HausenSharika Mucker Referring Phys:  [id number] Vernetta HoneyJAN A MANSY Diagnosing Phys: Debbe OdeaBrian Agbor-Etang MD IMPRESSIONS  1. Left ventricular ejection fraction, by estimation, is <20%. The left ventricle has severely decreased function. The left ventricle demonstrates global hypokinesis. The left ventricular internal cavity size was mildly to moderately dilated. Left ventricular diastolic parameters are indeterminate.  2. Right ventricular systolic function is severely reduced. The right ventricular size is normal. There is severely  elevated pulmonary artery systolic pressure.  3. Left atrial size was severely dilated.  4. Right atrial size was mildly dilated.  5. Large pleural effusion in the left lateral region.  6. The mitral valve is normal in structure. Mild mitral valve regurgitation.  7. The aortic valve is tricuspid. Aortic valve regurgitation is not visualized.  8. The inferior vena cava is dilated in size with <50% respiratory variability, suggesting right atrial pressure of 15 mmHg. Conclusion(s)/Recommendation(s): LV appex appears trabecualted, cannot r/o apical thrombus. recommend limited echo with contrast to evaluate presence of apical thrombus. FINDINGS  Left Ventricle: LV appex appears trabecualted, cannot r/o apical thrombus. recommend limited echo with contrast to evaluate apical thrombus. Left ventricular ejection fraction, by  estimation, is <20%. The left ventricle has severely decreased function. The left ventricle demonstrates global hypokinesis. The left ventricular internal cavity size was mildly to moderately dilated. There is no left ventricular hypertrophy. Left ventricular diastolic parameters are indeterminate. Right Ventricle: The right ventricular size is normal. No increase in right ventricular wall thickness. Right ventricular systolic function is severely reduced. There is severely elevated pulmonary artery systolic pressure. The tricuspid regurgitant velocity is 3.83 m/s, and with an assumed right atrial pressure of 15 mmHg, the estimated right ventricular systolic pressure is 73.7 mmHg. Left Atrium: Left atrial size was severely dilated. Right Atrium: Right atrial size was mildly dilated. Pericardium: There is no evidence of pericardial effusion. Mitral Valve: The mitral valve is normal in structure. Mild mitral valve regurgitation. Tricuspid Valve: The tricuspid valve is grossly normal. Tricuspid valve regurgitation is mild. Aortic Valve: The aortic valve is tricuspid. Aortic valve regurgitation is not visualized. Pulmonic Valve: The pulmonic valve was normal in structure. Pulmonic valve regurgitation is not visualized. Aorta: The aortic root is normal in size and structure. Venous: The inferior vena cava is dilated in size with less than 50% respiratory variability, suggesting right atrial pressure of 15 mmHg. IAS/Shunts: No atrial level shunt detected by color flow Doppler. Additional Comments: There is a large pleural effusion in the left lateral region.  LEFT VENTRICLE PLAX 2D LVIDd:         6.29 cm LVIDs:         5.74 cm LV PW:         0.66 cm LV IVS:        0.87 cm LVOT diam:     1.90 cm LVOT Area:     2.84 cm  LV Volumes (MOD) LV vol d, MOD A2C: 175.0 ml LV vol d, MOD A4C: 157.0 ml LV vol s, MOD A2C: 152.0 ml LV vol s, MOD A4C: 136.0 ml LV SV MOD A2C:     23.0 ml LV SV MOD A4C:     157.0 ml LV SV MOD BP:       28.2 ml IVC IVC diam: 2.49 cm LEFT ATRIUM             Index       RIGHT ATRIUM           Index LA diam:        5.10 cm 2.90 cm/m  RA Area:     18.70 cm LA Vol (A2C):   90.4 ml 51.47 ml/m RA Volume:   60.40 ml  34.39 ml/m LA Vol (A4C):   77.2 ml 43.96 ml/m LA Biplane Vol: 89.4 ml 50.90 ml/m   AORTA Ao Root diam: 3.10 cm TRICUSPID VALVE TR Peak grad:   58.7 mmHg TR Vmax:  383.00 cm/s  SHUNTS Systemic Diam: 1.90 cm Debbe Odea MD Electronically signed by Debbe Odea MD Signature Date/Time: 04/05/2020/12:28:05 PM    Final     Microbiology Recent Results (from the past 240 hour(s))  Resp Panel by RT-PCR (Flu A&B, Covid) Nasopharyngeal Swab     Status: None   Collection Time: 04/05/20 12:45 AM   Specimen: Nasopharyngeal Swab; Nasopharyngeal(NP) swabs in vial transport medium  Result Value Ref Range Status   SARS Coronavirus 2 by RT PCR NEGATIVE NEGATIVE Final    Comment: (NOTE) SARS-CoV-2 target nucleic acids are NOT DETECTED.  The SARS-CoV-2 RNA is generally detectable in upper respiratory specimens during the acute phase of infection. The lowest concentration of SARS-CoV-2 viral copies this assay can detect is 138 copies/mL. A negative result does not preclude SARS-Cov-2 infection and should not be used as the sole basis for treatment or other patient management decisions. A negative result may occur with  improper specimen collection/handling, submission of specimen other than nasopharyngeal swab, presence of viral mutation(s) within the areas targeted by this assay, and inadequate number of viral copies(<138 copies/mL). A negative result must be combined with clinical observations, patient history, and epidemiological information. The expected result is Negative.  Fact Sheet for Patients:  BloggerCourse.com  Fact Sheet for Healthcare Providers:  SeriousBroker.it  This test is no t yet approved or cleared by the  [place] FDA and  has been authorized for detection and/or diagnosis of SARS-CoV-2 by FDA under an Emergency Use Authorization (EUA). This EUA will remain  in effect (meaning this test can be used) for the duration of the COVID-19 declaration under Section 564(b)(1) of the Act, 21 U.S.C.section 360bbb-3(b)(1), unless the authorization is terminated  or revoked sooner.       Influenza A by PCR NEGATIVE NEGATIVE Final   Influenza B by PCR NEGATIVE NEGATIVE Final    Comment: (NOTE) The Xpert Xpress SARS-CoV-2/FLU/RSV plus assay is intended as an aid in the diagnosis of influenza from Nasopharyngeal swab specimens and should not be used as a sole basis for treatment. Nasal washings and aspirates are unacceptable for Xpert Xpress SARS-CoV-2/FLU/RSV testing.  Fact Sheet for Patients: BloggerCourse.com  Fact Sheet for Healthcare Providers: SeriousBroker.it  This test is not yet approved or cleared by the [place] FDA and has been authorized for detection and/or diagnosis of SARS-CoV-2 by FDA under an Emergency Use Authorization (EUA). This EUA will remain in effect (meaning this test can be used) for the duration of the COVID-19 declaration under Section 564(b)(1) of the Act, 21 U.S.C. section 360bbb-3(b)(1), unless the authorization is terminated or revoked.  Performed at [facility], [address] [place], [place] [address]   Culture, blood (single)     Status: None (Preliminary result)   Collection Time: 04/05/20 12:45 AM   Specimen: BLOOD  Result Value Ref Range Status   Specimen Description BLOOD RIGHT FOREARM  Final   Special Requests   Final    BOTTLES DRAWN AEROBIC AND ANAEROBIC Blood Culture results may not be optimal due to an excessive volume of blood received in culture bottles   Culture   Final    NO GROWTH 1 DAY Performed at [facility], [address], [place], [place] [address]     Report Status PENDING  Incomplete    Lab Basic Metabolic Panel: Recent Labs  Lab 04/24/2020 2000 04/26/2020 0150  NA 134* 133*  K 3.8 4.4  CL 99 101  CO2 20* 12*  GLUCOSE 167*  191*  BUN 12 29*  CREATININE 1.22 2.26*  CALCIUM 8.6* 9.1  MG 1.7 3.9*  PHOS  --  7.7*   Liver Function Tests: Recent Labs  Lab 04/30/2020 0150  AST 1,532*  ALT 997*  ALKPHOS 200*  BILITOT 1.9*  PROT 5.5*  ALBUMIN 2.1*   No results for input(s): LIPASE, AMYLASE in the last 168 hours. No results for input(s): AMMONIA in the last 168 hours. CBC: Recent Labs  Lab 04/15/20 2000 04/05/20 0341 04/30/2020 0150  WBC 11.7* 12.6* 19.0*  NEUTROABS  --   --  15.2*  HGB 15.1 14.0 13.1  HCT 43.4 42.3 43.2  MCV 96.0 98.4 108.8*  PLT 278 256 180   Cardiac Enzymes: No results for input(s): CKTOTAL, CKMB, CKMBINDEX, TROPONINI in the last 168 hours. Sepsis Labs: Recent Labs  Lab Apr 15, 2020 2000 2020/04/15 2353 04/05/20 0304 04/05/20 0341 04/05/20 0459 04/05/20 0632 04/05/20 0806 04/05/20 1100 04/19/2020 0150  PROCALCITON  --   --   --   --   --   --  0.42  --   --   WBC 11.7*  --   --  12.6*  --   --   --   --  19.0*  LATICACIDVEN  --    < >   < >  --  3.5* 2.8* 2.6* 2.8*  --    < > = values in this interval not displayed.    Procedures/Operations  -Intubated with 7.5 ETT -Attempted x 2 to placed left femoral A-line unsuccessful -I/O was placed   [employer] 04/17/2020, 7:24 AM

## 2020-05-03 DEATH — deceased

## 2022-07-19 IMAGING — CR DG CHEST 2V
2 series · 2 of 2 positions shown · non-contrast
Comparison: None.

CLINICAL DATA: Cough, shortness of breath

EXAM:
CHEST - 2 VIEW

[chest pa]
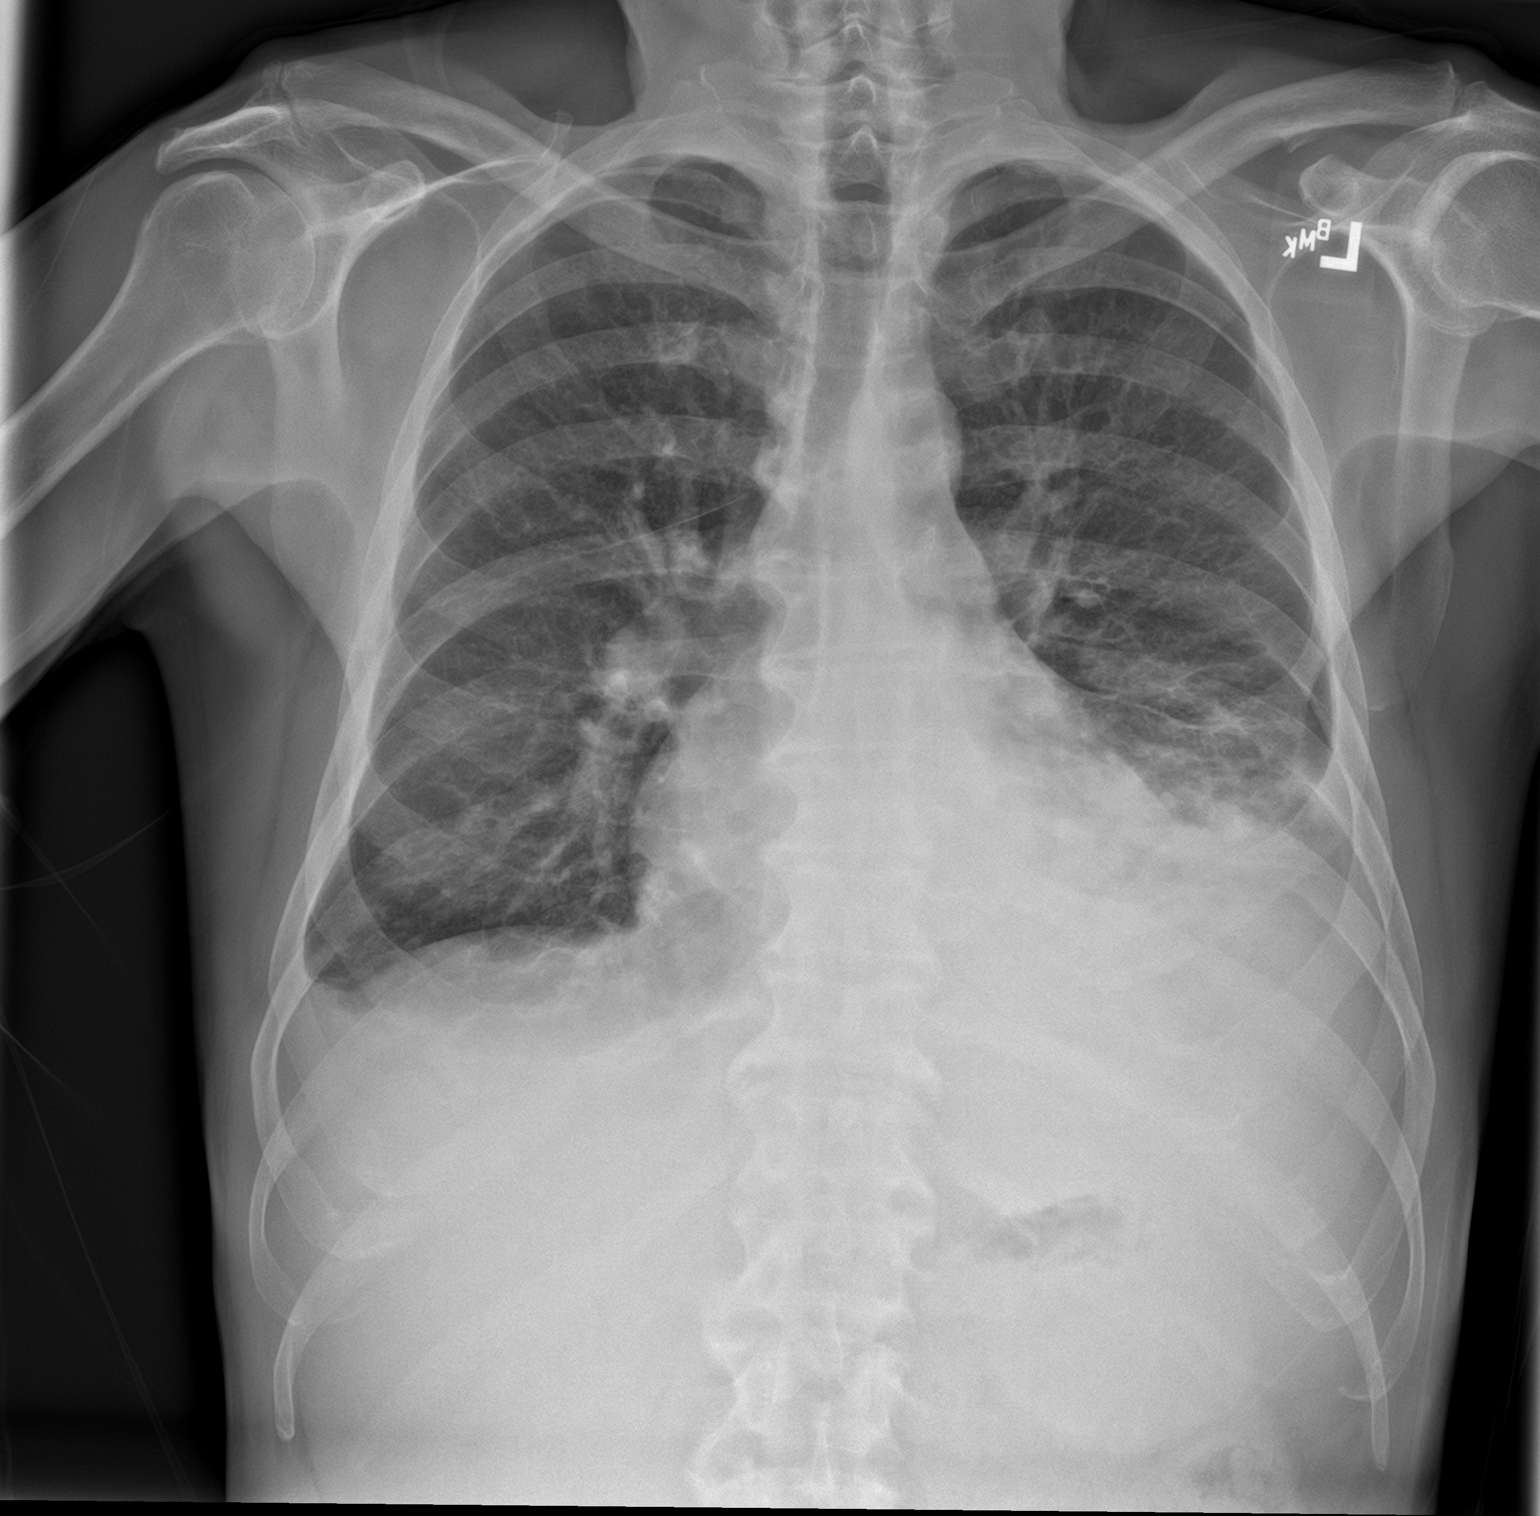

[chest lat]
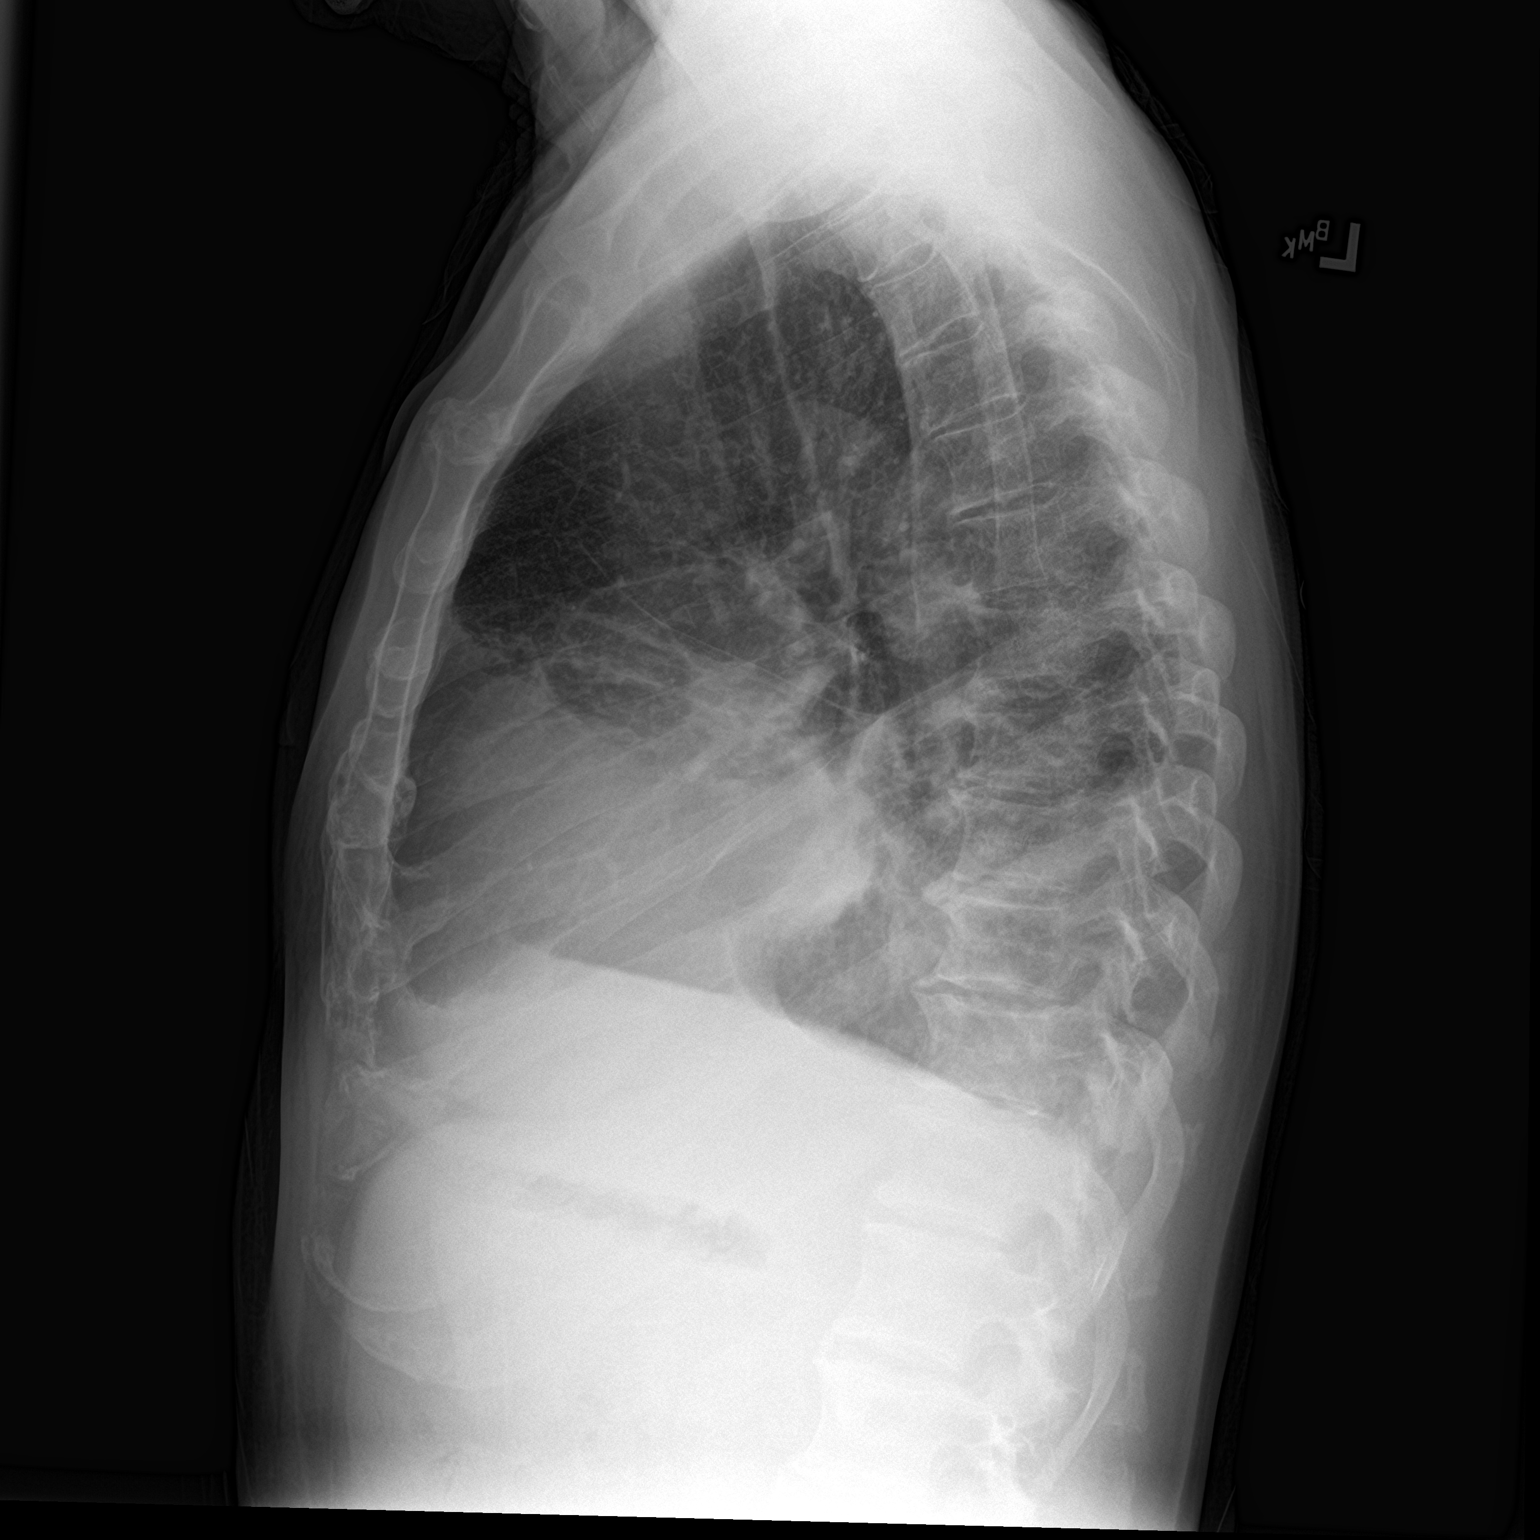

[2 of 2 positions shown; findings below may reference images not displayed]

FINDINGS: Bilateral pleural effusions, left greater than right with some
additional opacity in the left lung base some of which could be
atelectatic though more patchy adjacent airspace opacity is
suggestive of some underlying consolidation as well. No
pneumothorax. Pulmonary vascularity is normally distributed. The
cardiomediastinal contours are unremarkable. Degenerative changes
are present in the imaged spine and shoulders. No acute osseous or
soft tissue abnormality.
IMPRESSION: Bilateral pleural effusions, left greater than right.

Adjacent opacity could reflect a combination of atelectasis and
underlying airspace disease. At minimum, follow-up imaging in 6-8
weeks following therapeutic regimen should be performed to ensure
resolution.
